# Patient Record
Sex: Male | Born: 1970
Health system: Southern US, Community
[De-identification: ages and names within clinical notes are randomized; demographics above are authoritative.]

## PROBLEM LIST (undated history)

## (undated) DIAGNOSIS — Z21 Asymptomatic human immunodeficiency virus [HIV] infection status: Secondary | ICD-10-CM

## (undated) DIAGNOSIS — F419 Anxiety disorder, unspecified: Secondary | ICD-10-CM

## (undated) DIAGNOSIS — F329 Major depressive disorder, single episode, unspecified: Secondary | ICD-10-CM

## (undated) DIAGNOSIS — B20 Human immunodeficiency virus [HIV] disease: Secondary | ICD-10-CM

## (undated) DIAGNOSIS — F32A Depression, unspecified: Secondary | ICD-10-CM

## (undated) DIAGNOSIS — IMO0001 Reserved for inherently not codable concepts without codable children: Secondary | ICD-10-CM

## (undated) DIAGNOSIS — I10 Essential (primary) hypertension: Secondary | ICD-10-CM

## (undated) HISTORY — DX: Depression, unspecified: F32.A

## (undated) HISTORY — DX: Major depressive disorder, single episode, unspecified: F32.9

## (undated) HISTORY — DX: Reserved for inherently not codable concepts without codable children: IMO0001

## (undated) HISTORY — DX: Essential (primary) hypertension: I10

## (undated) HISTORY — DX: Anxiety disorder, unspecified: F41.9

## (undated) HISTORY — DX: Human immunodeficiency virus (HIV) disease: B20

## (undated) HISTORY — DX: Asymptomatic human immunodeficiency virus (hiv) infection status: Z21

---

## 2000-01-02 ENCOUNTER — Ambulatory Visit (HOSPITAL_COMMUNITY): Admission: RE | Admit: 2000-01-02 | Discharge: 2000-01-02 | Payer: Self-pay | Admitting: Infectious Diseases

## 2000-01-02 ENCOUNTER — Encounter: Payer: Self-pay | Admitting: Infectious Disease

## 2000-01-02 ENCOUNTER — Encounter (INDEPENDENT_AMBULATORY_CARE_PROVIDER_SITE_OTHER): Payer: Self-pay | Admitting: *Deleted

## 2000-01-02 ENCOUNTER — Encounter: Admission: RE | Admit: 2000-01-02 | Discharge: 2000-01-02 | Payer: Self-pay | Admitting: Infectious Diseases

## 2000-01-04 ENCOUNTER — Encounter: Admission: RE | Admit: 2000-01-04 | Discharge: 2000-01-04 | Payer: Self-pay | Admitting: Infectious Diseases

## 2000-01-04 ENCOUNTER — Ambulatory Visit (HOSPITAL_COMMUNITY): Admission: RE | Admit: 2000-01-04 | Discharge: 2000-01-04 | Payer: Self-pay | Admitting: Infectious Diseases

## 2000-01-08 ENCOUNTER — Encounter: Admission: RE | Admit: 2000-01-08 | Discharge: 2000-01-08 | Payer: Self-pay | Admitting: Infectious Diseases

## 2000-01-15 ENCOUNTER — Encounter: Admission: RE | Admit: 2000-01-15 | Discharge: 2000-01-15 | Payer: Self-pay | Admitting: Infectious Diseases

## 2000-02-18 ENCOUNTER — Encounter: Admission: RE | Admit: 2000-02-18 | Discharge: 2000-02-18 | Payer: Self-pay | Admitting: Infectious Diseases

## 2000-04-14 ENCOUNTER — Encounter: Admission: RE | Admit: 2000-04-14 | Discharge: 2000-04-14 | Payer: Self-pay | Admitting: Infectious Diseases

## 2000-04-14 ENCOUNTER — Ambulatory Visit (HOSPITAL_COMMUNITY): Admission: RE | Admit: 2000-04-14 | Discharge: 2000-04-14 | Payer: Self-pay | Admitting: Infectious Diseases

## 2000-05-19 ENCOUNTER — Encounter: Admission: RE | Admit: 2000-05-19 | Discharge: 2000-05-19 | Payer: Self-pay | Admitting: Infectious Diseases

## 2000-07-29 ENCOUNTER — Ambulatory Visit (HOSPITAL_COMMUNITY): Admission: RE | Admit: 2000-07-29 | Discharge: 2000-07-29 | Payer: Self-pay | Admitting: Infectious Diseases

## 2000-07-29 ENCOUNTER — Encounter: Admission: RE | Admit: 2000-07-29 | Discharge: 2000-07-29 | Payer: Self-pay | Admitting: Infectious Diseases

## 2000-09-29 ENCOUNTER — Encounter: Admission: RE | Admit: 2000-09-29 | Discharge: 2000-09-29 | Payer: Self-pay | Admitting: Infectious Diseases

## 2000-12-01 ENCOUNTER — Ambulatory Visit (HOSPITAL_COMMUNITY): Admission: RE | Admit: 2000-12-01 | Discharge: 2000-12-01 | Payer: Self-pay | Admitting: Infectious Diseases

## 2000-12-01 ENCOUNTER — Encounter: Admission: RE | Admit: 2000-12-01 | Discharge: 2000-12-01 | Payer: Self-pay | Admitting: Internal Medicine

## 2000-12-15 ENCOUNTER — Encounter: Admission: RE | Admit: 2000-12-15 | Discharge: 2000-12-15 | Payer: Self-pay | Admitting: Infectious Diseases

## 2001-03-24 ENCOUNTER — Encounter: Admission: RE | Admit: 2001-03-24 | Discharge: 2001-03-24 | Payer: Self-pay | Admitting: Infectious Diseases

## 2001-03-24 ENCOUNTER — Ambulatory Visit (HOSPITAL_COMMUNITY): Admission: RE | Admit: 2001-03-24 | Discharge: 2001-03-24 | Payer: Self-pay | Admitting: Infectious Diseases

## 2001-04-07 ENCOUNTER — Encounter: Admission: RE | Admit: 2001-04-07 | Discharge: 2001-04-07 | Payer: Self-pay | Admitting: Infectious Diseases

## 2001-07-03 ENCOUNTER — Ambulatory Visit (HOSPITAL_COMMUNITY): Admission: RE | Admit: 2001-07-03 | Discharge: 2001-07-03 | Payer: Self-pay | Admitting: Infectious Diseases

## 2001-07-20 ENCOUNTER — Encounter: Admission: RE | Admit: 2001-07-20 | Discharge: 2001-07-20 | Payer: Self-pay | Admitting: Infectious Diseases

## 2001-08-31 ENCOUNTER — Encounter: Admission: RE | Admit: 2001-08-31 | Discharge: 2001-08-31 | Payer: Self-pay | Admitting: Infectious Diseases

## 2001-11-23 ENCOUNTER — Encounter: Admission: RE | Admit: 2001-11-23 | Discharge: 2001-11-23 | Payer: Self-pay | Admitting: Infectious Diseases

## 2001-11-23 ENCOUNTER — Ambulatory Visit (HOSPITAL_COMMUNITY): Admission: RE | Admit: 2001-11-23 | Discharge: 2001-11-23 | Payer: Self-pay | Admitting: Infectious Diseases

## 2001-12-07 ENCOUNTER — Encounter: Admission: RE | Admit: 2001-12-07 | Discharge: 2001-12-07 | Payer: Self-pay | Admitting: Infectious Diseases

## 2002-01-11 ENCOUNTER — Encounter: Admission: RE | Admit: 2002-01-11 | Discharge: 2002-01-11 | Payer: Self-pay | Admitting: Infectious Diseases

## 2002-03-15 ENCOUNTER — Encounter: Admission: RE | Admit: 2002-03-15 | Discharge: 2002-03-15 | Payer: Self-pay | Admitting: Infectious Diseases

## 2002-03-15 ENCOUNTER — Ambulatory Visit (HOSPITAL_COMMUNITY): Admission: RE | Admit: 2002-03-15 | Discharge: 2002-03-15 | Payer: Self-pay | Admitting: Infectious Diseases

## 2002-03-29 ENCOUNTER — Encounter: Admission: RE | Admit: 2002-03-29 | Discharge: 2002-03-29 | Payer: Self-pay | Admitting: Infectious Diseases

## 2002-09-14 ENCOUNTER — Encounter: Admission: RE | Admit: 2002-09-14 | Discharge: 2002-09-14 | Payer: Self-pay | Admitting: Infectious Diseases

## 2002-10-05 ENCOUNTER — Encounter: Admission: RE | Admit: 2002-10-05 | Discharge: 2002-10-05 | Payer: Self-pay | Admitting: Infectious Diseases

## 2002-12-30 ENCOUNTER — Encounter: Admission: RE | Admit: 2002-12-30 | Discharge: 2002-12-30 | Payer: Self-pay | Admitting: Infectious Diseases

## 2002-12-30 ENCOUNTER — Ambulatory Visit (HOSPITAL_COMMUNITY): Admission: RE | Admit: 2002-12-30 | Discharge: 2002-12-30 | Payer: Self-pay | Admitting: Infectious Diseases

## 2003-01-17 ENCOUNTER — Encounter: Admission: RE | Admit: 2003-01-17 | Discharge: 2003-01-17 | Payer: Self-pay | Admitting: Infectious Diseases

## 2003-05-17 ENCOUNTER — Encounter (INDEPENDENT_AMBULATORY_CARE_PROVIDER_SITE_OTHER): Payer: Self-pay | Admitting: Infectious Diseases

## 2003-05-17 ENCOUNTER — Ambulatory Visit (HOSPITAL_COMMUNITY): Admission: RE | Admit: 2003-05-17 | Discharge: 2003-05-17 | Payer: Self-pay | Admitting: Infectious Diseases

## 2003-05-17 ENCOUNTER — Encounter: Admission: RE | Admit: 2003-05-17 | Discharge: 2003-05-17 | Payer: Self-pay | Admitting: Infectious Diseases

## 2003-06-06 ENCOUNTER — Encounter: Admission: RE | Admit: 2003-06-06 | Discharge: 2003-06-06 | Payer: Self-pay | Admitting: Infectious Diseases

## 2004-03-27 ENCOUNTER — Ambulatory Visit: Payer: Self-pay | Admitting: Infectious Diseases

## 2004-03-27 ENCOUNTER — Ambulatory Visit (HOSPITAL_COMMUNITY): Admission: RE | Admit: 2004-03-27 | Discharge: 2004-03-27 | Payer: Self-pay | Admitting: Infectious Diseases

## 2004-04-09 ENCOUNTER — Ambulatory Visit: Payer: Self-pay | Admitting: Infectious Diseases

## 2004-12-08 ENCOUNTER — Emergency Department (HOSPITAL_COMMUNITY): Admission: EM | Admit: 2004-12-08 | Discharge: 2004-12-08 | Payer: Self-pay | Admitting: Emergency Medicine

## 2005-03-04 ENCOUNTER — Ambulatory Visit: Payer: Self-pay | Admitting: Infectious Diseases

## 2005-03-04 ENCOUNTER — Ambulatory Visit (HOSPITAL_COMMUNITY): Admission: RE | Admit: 2005-03-04 | Discharge: 2005-03-04 | Payer: Self-pay | Admitting: Infectious Diseases

## 2005-03-04 ENCOUNTER — Encounter (INDEPENDENT_AMBULATORY_CARE_PROVIDER_SITE_OTHER): Payer: Self-pay | Admitting: *Deleted

## 2005-03-25 ENCOUNTER — Ambulatory Visit: Payer: Self-pay | Admitting: Infectious Diseases

## 2005-11-25 ENCOUNTER — Encounter (INDEPENDENT_AMBULATORY_CARE_PROVIDER_SITE_OTHER): Payer: Self-pay | Admitting: *Deleted

## 2005-11-25 ENCOUNTER — Ambulatory Visit: Payer: Self-pay | Admitting: Infectious Diseases

## 2005-11-25 ENCOUNTER — Encounter: Admission: RE | Admit: 2005-11-25 | Discharge: 2005-11-25 | Payer: Self-pay | Admitting: Infectious Diseases

## 2005-11-25 LAB — CONVERTED CEMR LAB: HIV 1 RNA Quant: 49 copies/mL

## 2005-12-09 ENCOUNTER — Ambulatory Visit: Payer: Self-pay | Admitting: Infectious Diseases

## 2006-07-28 ENCOUNTER — Encounter (INDEPENDENT_AMBULATORY_CARE_PROVIDER_SITE_OTHER): Payer: Self-pay | Admitting: *Deleted

## 2006-07-28 ENCOUNTER — Encounter: Admission: RE | Admit: 2006-07-28 | Discharge: 2006-07-28 | Payer: Self-pay | Admitting: Infectious Diseases

## 2006-07-28 ENCOUNTER — Ambulatory Visit: Payer: Self-pay | Admitting: Infectious Diseases

## 2006-07-28 LAB — CONVERTED CEMR LAB
ALT: 11 units/L (ref 0–53)
Alkaline Phosphatase: 58 units/L (ref 39–117)
Basophils Absolute: 0 10*3/uL (ref 0.0–0.1)
CD4 Count: 450 microliters
CO2: 26 meq/L (ref 19–32)
Hemoglobin: 16 g/dL (ref 13.0–17.0)
MCHC: 34 g/dL (ref 30.0–36.0)
Monocytes Absolute: 0.3 10*3/uL (ref 0.2–0.7)
Neutro Abs: 3.2 10*3/uL (ref 1.7–7.7)
Platelets: 271 10*3/uL (ref 150–400)
RDW: 13.9 % (ref 11.5–14.0)
Sodium: 140 meq/L (ref 135–145)
Total Bilirubin: 0.3 mg/dL (ref 0.3–1.2)
Total Protein: 7.4 g/dL (ref 6.0–8.3)

## 2006-08-11 ENCOUNTER — Ambulatory Visit: Payer: Self-pay | Admitting: Infectious Diseases

## 2006-08-18 ENCOUNTER — Encounter (INDEPENDENT_AMBULATORY_CARE_PROVIDER_SITE_OTHER): Payer: Self-pay | Admitting: Infectious Diseases

## 2006-08-18 DIAGNOSIS — B20 Human immunodeficiency virus [HIV] disease: Secondary | ICD-10-CM | POA: Insufficient documentation

## 2006-09-01 ENCOUNTER — Encounter (INDEPENDENT_AMBULATORY_CARE_PROVIDER_SITE_OTHER): Payer: Self-pay | Admitting: *Deleted

## 2006-09-01 LAB — CONVERTED CEMR LAB

## 2006-09-14 ENCOUNTER — Encounter (INDEPENDENT_AMBULATORY_CARE_PROVIDER_SITE_OTHER): Payer: Self-pay | Admitting: *Deleted

## 2007-08-18 ENCOUNTER — Ambulatory Visit: Payer: Self-pay | Admitting: Infectious Disease

## 2007-08-18 ENCOUNTER — Encounter: Admission: RE | Admit: 2007-08-18 | Discharge: 2007-08-18 | Payer: Self-pay | Admitting: Infectious Disease

## 2007-08-18 LAB — CONVERTED CEMR LAB
ALT: 16 units/L (ref 0–53)
Albumin: 4.8 g/dL (ref 3.5–5.2)
Basophils Absolute: 0 10*3/uL (ref 0.0–0.1)
CO2: 23 meq/L (ref 19–32)
Cholesterol: 135 mg/dL (ref 0–200)
Eosinophils Relative: 2 % (ref 0–5)
Glucose, Bld: 90 mg/dL (ref 70–99)
HCT: 47.6 % (ref 39.0–52.0)
HIV 1 RNA Quant: 203 copies/mL — ABNORMAL HIGH (ref <50)
HIV-1 RNA Quant, Log: 2.31 — ABNORMAL HIGH (ref <1.70)
Hemoglobin: 15.6 g/dL (ref 13.0–17.0)
LDL Cholesterol: 72 mg/dL (ref 0–99)
Lymphs Abs: 1 10*3/uL (ref 0.7–4.0)
MCHC: 32.8 g/dL (ref 30.0–36.0)
MCV: 92.8 fL (ref 78.0–100.0)
Potassium: 4.5 meq/L (ref 3.5–5.3)
RBC: 5.13 M/uL (ref 4.22–5.81)
Sodium: 140 meq/L (ref 135–145)
Total Bilirubin: 0.3 mg/dL (ref 0.3–1.2)
Total Protein: 7.5 g/dL (ref 6.0–8.3)
Triglycerides: 147 mg/dL (ref <150)
VLDL: 29 mg/dL (ref 0–40)

## 2007-09-25 ENCOUNTER — Encounter (INDEPENDENT_AMBULATORY_CARE_PROVIDER_SITE_OTHER): Payer: Self-pay | Admitting: *Deleted

## 2007-09-28 ENCOUNTER — Ambulatory Visit: Payer: Self-pay | Admitting: Infectious Disease

## 2007-09-28 DIAGNOSIS — J301 Allergic rhinitis due to pollen: Secondary | ICD-10-CM | POA: Insufficient documentation

## 2007-09-28 LAB — CONVERTED CEMR LAB: Hep A Total Ab: NEGATIVE

## 2008-01-18 ENCOUNTER — Ambulatory Visit: Payer: Self-pay | Admitting: Infectious Disease

## 2008-01-18 ENCOUNTER — Encounter: Admission: RE | Admit: 2008-01-18 | Discharge: 2008-01-18 | Payer: Self-pay | Admitting: Infectious Disease

## 2008-01-18 LAB — CONVERTED CEMR LAB
ALT: 13 units/L (ref 0–53)
AST: 16 units/L (ref 0–37)
Albumin: 4.7 g/dL (ref 3.5–5.2)
Basophils Absolute: 0 10*3/uL (ref 0.0–0.1)
Basophils Relative: 1 % (ref 0–1)
CO2: 23 meq/L (ref 19–32)
Calcium: 9.7 mg/dL (ref 8.4–10.5)
Chloride: 103 meq/L (ref 96–112)
Lymphocytes Relative: 23 % (ref 12–46)
MCHC: 32.8 g/dL (ref 30.0–36.0)
Neutro Abs: 2.6 10*3/uL (ref 1.7–7.7)
Neutrophils Relative %: 63 % (ref 43–77)
Potassium: 4.7 meq/L (ref 3.5–5.3)
RBC: 5.15 M/uL (ref 4.22–5.81)
RDW: 14.8 % (ref 11.5–15.5)
Total Protein: 7.3 g/dL (ref 6.0–8.3)

## 2008-02-01 ENCOUNTER — Ambulatory Visit: Payer: Self-pay | Admitting: Infectious Disease

## 2008-02-19 ENCOUNTER — Encounter: Payer: Self-pay | Admitting: Infectious Disease

## 2008-02-24 ENCOUNTER — Ambulatory Visit (HOSPITAL_COMMUNITY): Admission: RE | Admit: 2008-02-24 | Discharge: 2008-02-24 | Payer: Self-pay | Admitting: Family Medicine

## 2008-05-23 ENCOUNTER — Ambulatory Visit: Payer: Self-pay | Admitting: Infectious Disease

## 2008-05-23 LAB — CONVERTED CEMR LAB
ALT: 15 units/L (ref 0–53)
Basophils Absolute: 0 10*3/uL (ref 0.0–0.1)
CO2: 25 meq/L (ref 19–32)
Cholesterol: 154 mg/dL (ref 0–200)
Creatinine, Ser: 0.98 mg/dL (ref 0.40–1.50)
Eosinophils Absolute: 0.1 10*3/uL (ref 0.0–0.7)
Eosinophils Relative: 2 % (ref 0–5)
HCT: 47.9 % (ref 39.0–52.0)
HIV-1 RNA Quant, Log: 2.54 — ABNORMAL HIGH (ref <1.68)
Hemoglobin: 15.4 g/dL (ref 13.0–17.0)
Lymphocytes Relative: 23 % (ref 12–46)
MCHC: 32.2 g/dL (ref 30.0–36.0)
MCV: 95 fL (ref 78.0–100.0)
Monocytes Absolute: 0.3 10*3/uL (ref 0.1–1.0)
RDW: 14.2 % (ref 11.5–15.5)
Total Bilirubin: 0.4 mg/dL (ref 0.3–1.2)
Total CHOL/HDL Ratio: 3.8
Triglycerides: 123 mg/dL (ref <150)
VLDL: 25 mg/dL (ref 0–40)

## 2008-06-13 ENCOUNTER — Ambulatory Visit: Payer: Self-pay | Admitting: Infectious Disease

## 2008-10-03 ENCOUNTER — Ambulatory Visit: Payer: Self-pay | Admitting: Infectious Disease

## 2008-10-03 LAB — CONVERTED CEMR LAB
Bilirubin Urine: NEGATIVE
CO2: 26 meq/L (ref 19–32)
Cholesterol: 170 mg/dL (ref 0–200)
Creatinine, Ser: 1.1 mg/dL (ref 0.40–1.50)
GFR calc Af Amer: >60 mL/min (ref >60)
GFR calc non Af Amer: >60 mL/min (ref >60)
Glucose, Bld: 87 mg/dL (ref 70–99)
HIV 1 RNA Quant: 776 copies/mL — ABNORMAL HIGH (ref <48)
Hemoglobin: 15.6 g/dL (ref 13.0–17.0)
Lymphocytes Relative: 19 % (ref 12–46)
Monocytes Absolute: 0.3 10*3/uL (ref 0.1–1.0)
Monocytes Relative: 6 % (ref 3–12)
Neutro Abs: 3.8 10*3/uL (ref 1.7–7.7)
Protein, ur: NEGATIVE mg/dL
RBC: 5.26 M/uL (ref 4.22–5.81)
Total Bilirubin: 0.3 mg/dL (ref 0.3–1.2)
Total CHOL/HDL Ratio: 4.5
Triglycerides: 172 mg/dL — ABNORMAL HIGH (ref <150)
Urine Glucose: NEGATIVE mg/dL
VLDL: 34 mg/dL (ref 0–40)
WBC: 5.3 10*3/uL (ref 4.0–10.5)
pH: 6 (ref 5.0–8.0)

## 2009-11-15 ENCOUNTER — Ambulatory Visit: Payer: Self-pay | Admitting: Infectious Disease

## 2009-11-15 LAB — CONVERTED CEMR LAB
ALT: 13 units/L (ref 0–53)
Albumin: 4.6 g/dL (ref 3.5–5.2)
Basophils Relative: 1 % (ref 0–1)
Bilirubin Urine: NEGATIVE
CO2: 27 meq/L (ref 19–32)
Calcium: 9.1 mg/dL (ref 8.4–10.5)
Chlamydia, Swab/Urine, PCR: NEGATIVE
Chloride: 103 meq/L (ref 96–112)
Glucose, Bld: 73 mg/dL (ref 70–99)
Hemoglobin: 15.4 g/dL (ref 13.0–17.0)
Ketones, ur: NEGATIVE mg/dL
Lymphocytes Relative: 22 % (ref 12–46)
Lymphs Abs: 1 10*3/uL (ref 0.7–4.0)
MCHC: 33 g/dL (ref 30.0–36.0)
Monocytes Absolute: 0.3 10*3/uL (ref 0.1–1.0)
Monocytes Relative: 7 % (ref 3–12)
Neutro Abs: 3.1 10*3/uL (ref 1.7–7.7)
Potassium: 4 meq/L (ref 3.5–5.3)
Protein, ur: NEGATIVE mg/dL
RBC: 4.97 M/uL (ref 4.22–5.81)
Sodium: 139 meq/L (ref 135–145)
Total Protein: 7 g/dL (ref 6.0–8.3)
Urine Glucose: 100 mg/dL — AB
Urobilinogen, UA: 0.2 (ref 0.0–1.0)
WBC: 4.4 10*3/uL (ref 4.0–10.5)

## 2009-11-30 ENCOUNTER — Ambulatory Visit: Payer: Self-pay | Admitting: Infectious Disease

## 2009-11-30 DIAGNOSIS — J3489 Other specified disorders of nose and nasal sinuses: Secondary | ICD-10-CM | POA: Insufficient documentation

## 2009-11-30 DIAGNOSIS — Z87891 Personal history of nicotine dependence: Secondary | ICD-10-CM | POA: Insufficient documentation

## 2009-11-30 DIAGNOSIS — R635 Abnormal weight gain: Secondary | ICD-10-CM | POA: Insufficient documentation

## 2010-06-05 ENCOUNTER — Encounter (INDEPENDENT_AMBULATORY_CARE_PROVIDER_SITE_OTHER): Payer: Self-pay | Admitting: *Deleted

## 2010-08-07 NOTE — Assessment & Plan Note (Signed)
Summary: resc from am/vs   Visit Type:  Follow-up Primary Provider:  Paulette Blanch Dam MD  CC:  f/u labs.  History of Present Illness: 40 yo Caucasian male with HIV perfectly suppressed with healthy cd4 count. He complains of weight gain but otherwise has not complaints. He is free of tobacco. His seasonal allergies are not bothering him at present.   Problems Prior to Update: 1)  Preventive Health Care  (ICD-V70.0) 2)  Allergic Rhinitis, Seasonal  (ICD-477.0) 3)  HIV Disease  (ICD-042)  Medications Prior to Update: 1)  Atripla 600-200-300 Mg Tabs (Efavirenz-Emtricitab-Tenofovir) .... Take 1 Tablet By Mouth At Bedtime 2)  Nasonex 50 Mcg/act Susp (Mometasone Furoate) .... One To Two Puffs Each Nose Once A Day  Current Medications (verified): 1)  Atripla 600-200-300 Mg Tabs (Efavirenz-Emtricitab-Tenofovir) .... Take 1 Tablet By Mouth At Bedtime 2)  Nasonex 50 Mcg/act Susp (Mometasone Furoate) .... One To Two Puffs Each Nose Once A Day  Allergies: 1)  ! Codeine   Preventive Screening-Counseling & Management  Alcohol-Tobacco     Alcohol drinks/day: 0     Smoking Status: quit     Smoking Cessation Counseling: 03/25/2005     Year Quit: 2007  Caffeine-Diet-Exercise     Caffeine use/day: 2-3 cups of coffee, 2 soft drinks     Does Patient Exercise: yes     Type of exercise: work out at home     Exercise (avg: min/session): 30-60     Times/week: 3   Current Allergies (reviewed today): ! CODEINE Family History: Reviewed history from 10/03/2008 and no changes required. Dad with CHF atrial fibrilatoin and CVA Mom with cerebral hemmorrhage No history of colon cancer  Social History: Reviewed history from 09/28/2007 and no changes required. 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    Vital Signs:  Patient profile:   40 year old male Height:      73 inches (185.42 cm) Weight:      225.4 pounds (102.45 kg) BMI:     29.85 Pulse rate:   96 / minute BP sitting:   119 / 74  (left arm)  Vitals Entered By: Starleen Arms CMA (Nov 30, 2009 2:39 PM) CC: f/u labs Is Patient Diabetic? No Pain Assessment Patient in pain? no      Nutritional Status BMI of 25 - 29 = overweight Nutritional Status Detail nl  Does patient need assistance? Functional Status Self care Ambulation Normal   Physical Exam  General:  alert, well-developed, and well-nourished.   Head:  normocephalic, atraumatic, and no abnormalities observed.   Ears:  no external deformities.   Nose:  no external deformity no nasal discharge.   Mouth:  good dentition, no gingival abnormalities, no dental plaque, pharynx pink and moist, no erythema, and no exudates.   Neck:  supple and full ROM.   Lungs:  normal respiratory effort, no crackles, and no wheezes.   Heart:  normal rate, regular rhythm, no murmur, no gallop, and no rub.   Abdomen:  soft, non-tender, normal bowel sounds, no distention, and no masses.   Msk:  normal ROM and no joint  deformities.   Extremities:  No clubbing, cyanosis, edema, or deformity noted with normal full range of motion of all joints.   Neurologic:  alert & oriented X3, strength normal in all extremities, and gait normal.   Skin:  no rashes and no suspicious lesions.   Psych:  Oriented X3.  memory intact for recent and remote, normally interactive, and good eye contact.          Medication Adherence: 11/30/2009   Adherence to  medications reviewed with patient. Counseling to provide adequate adherence provided   Prevention For Positives: 11/30/2009   Safe sex practices discussed with patient. Condoms offered.   Education Materials Provided: 11/30/2009 Safe sex practices discussed with patient. Condoms offered.                          Impression & Recommendations:  Problem # 1:  HIV DISEASE (ICD-042) Excellent control! Orders: Est. Patient Level IV (28413)  Diagnostics Reviewed:  HIV: CDC-defined AIDS (02/19/2008)   CD4: 450 (11/15/2009)   WBC: 4.4 (11/15/2009)   Hgb: 15.4 (11/15/2009)   HCT: 46.6 (11/15/2009)   Platelets: 240 (11/15/2009) HIV-1 RNA: <48 copies/mL (11/15/2009)   HBSAg: No (09/01/2006)  Problem # 2:  ALLERGIC RHINITIS, SEASONAL (ICD-477.0) not active Orders: Est. Patient Level IV (24401)  Problem # 3:  TOBACCO ABUSE, HX OF (ICD-V15.82) still has not resumed, excellent job Orders: Est. Patient Level IV (02725)  Problem # 4:  OTHER DISEASES OF NASAL CAVITY AND SINUSES (ICD-478.19) no problems with sinuses at present Orders: Est. Patient Level IV (36644)  Problem # 5:  WEIGHT GAIN (ICD-783.1) counselled ton increase exercise and to try to cut calories and high carbohydrate foods from diet and increase fiber  Other Orders: Hepatitis A Vaccine (Adult Dose) (03474) Admin 1st Vaccine (25956) Future Orders: T-CD4SP ([facility]) (CD4SP) ... 05/29/2010 T-HIV Viral Load [id number]) ... 05/29/2010 T-CBC w/Diff ([id number]) ... 05/29/2010 T-Comprehensive Metabolic Panel [id number]) ... 05/29/2010 T-Lipid Profile [id number]) ... 05/29/2010 T-RPR (Syphilis) [phone]) ... 05/29/2010  Patient Instructions: 1)  Please schedule a follow-up appointment in 6 months. 2)  come in fasting for labs at next appt    Immunizations Administered:  Hepatitis A Vaccine # 2:    Vaccine Type: HepA    Site: left deltoid    Mfr: GlaxoSmithKline    Dose: 0.5 ml    Route: IM    Given  by: Starleen Arms CMA    Exp. Date: 09/09/2011    Lot #: [id number]    VIS given: 09/25/04 version given Nov 30, 2009.

## 2010-08-07 NOTE — Miscellaneous (Signed)
Summary: RW - AIDS Year  Clinical Lists Changes  Observations: Added new observation of YEARAIDSPOS: 2001  (02/19/2008 14:53) Added new observation of HIV STATUS: CDC-defined AIDS  (02/19/2008 14:53)

## 2010-08-07 NOTE — Miscellaneous (Signed)
  Clinical Lists Changes 

## 2010-08-07 NOTE — Miscellaneous (Signed)
Summary: needs Flu vaccine and Pneumovax @ nest visit  Clinical Lists Changes Needs Flu vaccine and Pneumovax @ nest visit. ..................................................................Marland KitchenJennet Maduro RN  September 25, 2007 10:10 AM  Observations: Added new observation of FLU VAX: Historical (07/08/2005 10:10) Added new observation of PNEUMOVAX: Historical (04/14/2000 10:10)         Influenza Immunization History:    Influenza # 1:  Historical (07/08/2005)  Pneumovax Immunization History:    Pneumovax # 1:  Historical (04/14/2000)

## 2010-08-07 NOTE — Miscellaneous (Signed)
Summary: Orders Update  Clinical Lists Changes  Orders: Added new Test order of T-CBC w/Diff ([id number]) - Signed 

## 2010-08-07 NOTE — Letter (Signed)
Summary: Thomas Armstrong-08/11/2006  Thomas Armstrong-08/11/2006   Imported By: Dorice Lamas 08/18/2006 13:32:07  _____________________________________________________________________  External Attachment:    Type:   Image     Comment:   External Document

## 2010-08-07 NOTE — Miscellaneous (Signed)
Summary: Lab orders  Clinical Lists Changes  Orders: Added new Test order of T-CBC w/Diff [id number]) - Signed Added new Test order of T-CD4 [id number]) - Signed Added new Test order of T-Comprehensive Metabolic Panel [id number]) - Signed Added new Test order of T-HIV Viral Load [phone]) - Signed Added new Test order of T-Lipid Profile ([id number]) - Signed Added new Test order of T-RPR (Syphilis) ([id number]) - Signed

## 2010-08-07 NOTE — Assessment & Plan Note (Signed)
Summary: Thomas Armstrong   Chief Complaint:  f/u.  History of Present Illness: 40 yo Caucasian male with HIV currently with viral load of 200 and cd4 count 501. He and his wife succesfully had [place] vitro [place] with sperm washing and they have a new Baby girl. He is sexually active only with his wife (HIV negative)and is using condoms with all forms of intercourse. He claims 100% adherence to  his atripla. He is in good spriits. He has no complaints today and denies depression.    Current Allergies: No known allergies   Past Medical History:    HIV disease    PCP in 2001    Herpes stomatitis     candida     verruca vulgaris   Family History:    Dad with CHF atrial fibrilatoin and CVA    Mom with cerebral hemmorrhage  Social History:    Producer, television/film/video for [employer]    Married    Former Smoker    Alcohol use-no    Drug use-no   Risk Factors:  Tobacco use:  quit Drug use:  no Alcohol use:  no   Review of Systems  The patient denies anorexia, fever, weight loss, weight gain, vision loss, decreased hearing, hoarseness, chest pain, syncope, dyspnea on exhertion, peripheral edema, prolonged cough, hemoptysis, abdominal pain, melena, hematochezia, severe indigestion/heartburn, and hematuria.     Vital Signs:  Patient Profile:   40 Years Old Male Height:     73 inches (185.42 cm) Weight:      211.50 pounds (96.14 kg) BMI:     28.00 Temp:     98.1 degrees F (36.72 degrees C) oral Pulse rate:   69 / minute BP sitting:   112 / 74  Pt. in pain?   no  Vitals Entered By: Starleen Arms (September 28, 2007 11:19 AM)              Is Patient Diabetic? No Nutritional Status BMI of 25 - 29 = overweight  Does patient need assistance? Functional Status Self care Ambulation Normal Comments no missed doses     Physical Exam  General:     alert, well-developed, and well-nourished.   Head:     normocephalic, atraumatic, and no abnormalities  observed.   Eyes:     vision grossly intact, pupils equal, pupils round, and pupils reactive to light.   Ears:     no external deformities.   Nose:     no external deformity.   Mouth:     good dentition, no gingival abnormalities, no dental plaque, pharynx pink and moist, no erythema, and no exudates.   Neck:     full ROM.   Lungs:     normal respiratory effort, no crackles, and no wheezes.   Heart:     normal rate, regular rhythm, no murmur, no gallop, and no rub.   Abdomen:     soft, non-tender, normal bowel sounds, no distention, and no masses.   Msk:     normal ROM.   Extremities:     No clubbing, cyanosis, edema, or deformity noted with normal full range of motion of all joints.   Neurologic:     alert & oriented X3.      Impression & Recommendations:  Problem # 1:  HIV DISEASE (ICD-042) I am not terribly concerned about the viral load of 200. He claims near 100% compliance. Continue atripla His updated medication list for this problem includes:  Atripla 600-200-300 Mg Tabs (Efavirenz-emtricitab-tenofovir) .Marland Kitchen... Take 1 tablet by mouth at bedtime  Orders: T-Hepatitis A Antibody ([id number]) Est. Patient Level IV (09811)  Future Orders: T-Comprehensive Metabolic Panel ([id number]) ... 12/14/2007 T-CD4 ([id number]) ... 12/14/2007 T-HIV Viral Load [phone]) ... 12/14/2007 T-CBC w/Diff ([id number]) ... 12/14/2007   Problem # 2:  ALLERGIC RHINITIS, SEASONAL (ICD-477.0) He takes otc nonsedating antihistamines for this. Orders: Est. Patient Level IV (36644)   Problem # 3:  PREVENTIVE HEALTH CARE (ICD-V70.0) check hep A ab. Orders: Est. Patient Level IV (03474)   Other Orders: Pneumococcal Vaccine (25956) Admin 1st Vaccine (38756)   Patient Instructions: 1)  Please schedule a follow-up appointment in late July or early August.    Prescriptions: ATRIPLA 600-200-300 MG TABS (EFAVIRENZ-EMTRICITAB-TENOFOVIR) Take 1 tablet by mouth at bedtime  #93 x  4   Entered and Authorized by:   Acey Lav MD   Signed by:   Paulette Blanch Dam MD on 09/28/2007   Method used:   Print then Give to Patient   RxID:   [id number]  ]  Influenza Immunization History:    Influenza # 1:  Historical (05/09/2007)  Pneumovax Vaccine    Vaccine Type: Pneumovax    Site: right buttock    Mfr: Merck    Dose: 0.5 ml    Route: IM    Given by: Starleen Arms    Exp. Date: 12/05/2008    Lot #: [id number]    VIS given: 02/03/96 version given September 28, 2007.

## 2010-08-07 NOTE — Miscellaneous (Signed)
Summary: Orders Update  Clinical Lists Changes  Orders: Added new Test order of T-Chlamydia  Probe, urine ([id number]) - Signed Added new Test order of T-CBC w/Diff ([address]-100[address]) - Signed Added new Test order of T-CD4SP ([facility]) (CD4SP) - Signed Added new Test order of T-GC Probe, urine ([id number]) - Signed Added new Test order of T-Comprehensive Metabolic Panel ([id number]) - Signed Added new Test order of T-HIV Viral Load ([id number]) - Signed Added new Test order of T-RPR (Syphilis) ([id number]) - Signed Added new Test order of T-Urinalysis (81003-65000) - Signed 

## 2010-08-07 NOTE — Assessment & Plan Note (Signed)
Summary: FU OV   Chief Complaint:  f/u ov.  History of Present Illness: 40 yo Caucasian male with HIV currently with viral load o 61 and cd4 count 450 . His little girl is now 53 months old and walking. l. He is sexually active only with his wife (HIV negative)and is using condoms with all forms of intercourse. He claims 100% adherence to  his atripla. He is exercising regularly. He has no complaints.     Prior Medications Reviewed Using: Patient Recall  Updated Prior Medication List: ATRIPLA 600-200-300 MG TABS (EFAVIRENZ-EMTRICITAB-TENOFOVIR) Take 1 tablet by mouth at bedtime  Current Allergies (reviewed today): ! CODEINE  Past Medical History:    Reviewed history from 09/28/2007 and no changes required:       HIV disease       PCP in 2001       Herpes stomatitis        candida        verruca vulgaris   Family History:    Reviewed history from 09/28/2007 and no changes required:       Dad with CHF atrial fibrilatoin and CVA       Mom with cerebral hemmorrhage  Social History:    Reviewed history from 09/28/2007 and no changes required:       Occupation:manager for [employer]       Married       Former Smoker       Alcohol use-no       Drug use-no   Risk Factors: Tobacco use:  quit    Year quit:  2007 Drug use:  no Alcohol use:  no   Review of Systems  The patient denies anorexia, fever, weight loss, weight gain, vision loss, decreased hearing, hoarseness, chest pain, syncope, dyspnea on exertion, peripheral edema, prolonged cough, headaches, hemoptysis, abdominal pain, severe indigestion/heartburn, hematuria, incontinence, genital sores, suspicious skin lesions, difficulty walking, and depression.     Vital Signs:  Patient Profile:   40 Years Old Male Height:     73 inches (185.42 cm) Weight:      208.8 pounds BMI:     27.65 BSA:     2.19 Temp:     98.2 degrees F oral BP sitting:   117 / 77  (left arm)  Pt. in pain?   no  Vitals Entered By: Tomasita Morrow RN (February 01, 2008 9:48 AM)              Is Patient Diabetic? No Nutritional Status BMI of 25 - 29 = overweight Nutritional Status Detail normal  Have you ever been in a relationship where you felt threatened, hurt or afraid?No  Domestic Violence Intervention noe  Does patient need assistance? Functional Status Self care Ambulation Normal Comments No missed doses of HAART      Physical Exam  General:     alert, well-developed, and well-nourished.   Head:     normocephalic, atraumatic, and no abnormalities observed.   Eyes:     vision grossly intact, pupils equal, pupils round, and pupils reactive to light.   Ears:     no external deformities.   Nose:     no external deformity.   Mouth:     good dentition, no gingival abnormalities, no dental plaque, pharynx pink and moist, no erythema, and no exudates.   Neck:     full ROM.   Lungs:     normal respiratory effort, no crackles, and no wheezes.  Heart:     normal rate, regular rhythm, no murmur, no gallop, and no rub.   Abdomen:     soft, non-tender, normal bowel sounds, no distention, and no masses.   Msk:     normal ROM.   Extremities:     No clubbing, cyanosis, edema, or deformity noted with normal full range of motion of all joints.   Neurologic:     alert & oriented X3.   Psych:     Oriented X3 and normally interactive.      Impression & Recommendations:  Problem # 1:  HIV DISEASE (ICD-042) Assessment: Improved Suppressed. His updated medication list for this problem includes:    Atripla 600-200-300 Mg Tabs (Efavirenz-emtricitab-tenofovir) .Marland Kitchen... Take 1 tablet by mouth at bedtime  Orders: Est. Patient Level IV (82956) T-Chlamydia  Probe, urine ([id number])   Problem # 2:  ALLERGIC RHINITIS, SEASONAL (ICD-477.0) controlled Orders: Est. Patient Level IV (69629)   Problem # 3:  PREVENTIVE HEALTH CARE (ICD-V70.0) update vacciations, reinforced exercise. Orders: Est. Patient Level IV  (52841)   Other Orders: Est. Patient Level IV (32440) T-GC Probe, urine ([id number]) Est. Patient Level IV (40347)  Future Orders: T-CD4 (42595-63875) ... 05/01/2008 T-HIV Viral Load [id number]) ... 05/01/2008 T-Lipid Profile [id number]) ... 05/01/2008 T-Comprehensive Metabolic Panel [id number]) ... 05/01/2008    Hepatitis A Vaccine # 1 (to be given today)     Patient Instructions: 1)  Please schedule a follow-up appointment in 4 months. 2)  Please come in fasting for your next set of labs   ]

## 2010-08-29 ENCOUNTER — Telehealth: Payer: Self-pay | Admitting: Infectious Disease

## 2010-09-25 LAB — T-HELPER CELL (CD4) - (RCID CLINIC ONLY): CD4 % Helper T Cell: 48 % (ref 33–55)

## 2010-10-04 NOTE — Progress Notes (Addendum)
Summary: Can we call Thomas Armstrong again  Phone Note Outgoing Call   Call placed by: Acey Lav MD,  August 29, 2010 3:52 PM Details for Reason: NEEDS FLU SHOT Summary of Call: I tried to reach Thomas Armstrong but only got his answering machine. He needs to come in for flu shot and 6 month labs. Initial call taken by: Acey Lav MD,  August 29, 2010 3:53 PM  Follow-up for Phone Call        I called and left a message as well on the ans. machine Follow-up by: Starleen Arms CMA,  September 10, 2010 8:49 AM  Additional Follow-up for Phone Call Additional follow up Details #1::        Can we call Thomas Armstrong about flu shot again? Thanks Additional Follow-up by: Acey Lav MD,  September 18, 2010 9:58 AM

## 2010-10-18 LAB — T-HELPER CELL (CD4) - (RCID CLINIC ONLY)
CD4 % Helper T Cell: 51 % (ref 33–55)
CD4 T Cell Abs: 550 uL (ref 400–2700)

## 2010-10-29 ENCOUNTER — Other Ambulatory Visit: Payer: Self-pay

## 2010-11-05 ENCOUNTER — Other Ambulatory Visit: Payer: Self-pay | Admitting: Licensed Clinical Social Worker

## 2010-11-05 ENCOUNTER — Other Ambulatory Visit (INDEPENDENT_AMBULATORY_CARE_PROVIDER_SITE_OTHER): Payer: BC Managed Care – PPO

## 2010-11-05 DIAGNOSIS — B20 Human immunodeficiency virus [HIV] disease: Secondary | ICD-10-CM

## 2010-11-05 LAB — CBC WITH DIFFERENTIAL/PLATELET
Hemoglobin: 16.1 g/dL (ref 13.0–17.0)
Lymphocytes Relative: 22 % (ref 12–46)
Lymphs Abs: 1.1 10*3/uL (ref 0.7–4.0)
MCH: 30.5 pg (ref 26.0–34.0)
Monocytes Relative: 6 % (ref 3–12)
Neutro Abs: 3.5 10*3/uL (ref 1.7–7.7)
Neutrophils Relative %: 70 % (ref 43–77)
Platelets: 249 10*3/uL (ref 150–400)
RBC: 5.28 MIL/uL (ref 4.22–5.81)
WBC: 4.9 10*3/uL (ref 4.0–10.5)

## 2010-11-06 LAB — COMPLETE METABOLIC PANEL WITH GFR
ALT: 22 U/L (ref 0–53)
AST: 20 U/L (ref 0–37)
Albumin: 4.6 g/dL (ref 3.5–5.2)
Alkaline Phosphatase: 57 U/L (ref 39–117)
BUN: 13 mg/dL (ref 6–23)
Calcium: 10 mg/dL (ref 8.4–10.5)
Chloride: 101 mEq/L (ref 96–112)
Creat: 0.92 mg/dL (ref 0.40–1.50)
Potassium: 4.3 mEq/L (ref 3.5–5.3)

## 2010-11-06 LAB — T-HELPER CELL (CD4) - (RCID CLINIC ONLY)
CD4 % Helper T Cell: 54 % (ref 33–55)
CD4 T Cell Abs: 580 uL (ref 400–2700)

## 2010-11-07 LAB — HIV-1 RNA QUANT-NO REFLEX-BLD
HIV 1 RNA Quant: <20 copies/mL (ref <20)
HIV-1 RNA Quant, Log: <1.3 {Log} (ref <1.30)

## 2010-11-13 ENCOUNTER — Other Ambulatory Visit: Payer: Self-pay | Admitting: Licensed Clinical Social Worker

## 2010-11-13 DIAGNOSIS — B20 Human immunodeficiency virus [HIV] disease: Secondary | ICD-10-CM

## 2010-11-13 MED ORDER — EFAVIRENZ-EMTRICITAB-TENOFOVIR 600-200-300 MG PO TABS: 1.0000 | ORAL_TABLET | Freq: Every day | ORAL | Status: DC

## 2010-11-19 ENCOUNTER — Ambulatory Visit (INDEPENDENT_AMBULATORY_CARE_PROVIDER_SITE_OTHER): Payer: BC Managed Care – PPO | Admitting: Infectious Disease

## 2010-11-19 ENCOUNTER — Encounter: Payer: Self-pay | Admitting: Infectious Disease

## 2010-11-19 VITALS — BP 115/80 | HR 84 | Temp 98.1°F | Ht 73.0 in | Wt 233.2 lb

## 2010-11-19 DIAGNOSIS — R635 Abnormal weight gain: Secondary | ICD-10-CM

## 2010-11-19 DIAGNOSIS — B20 Human immunodeficiency virus [HIV] disease: Secondary | ICD-10-CM

## 2010-11-19 DIAGNOSIS — Z87891 Personal history of nicotine dependence: Secondary | ICD-10-CM

## 2010-11-19 DIAGNOSIS — J301 Allergic rhinitis due to pollen: Secondary | ICD-10-CM

## 2010-11-19 NOTE — Assessment & Plan Note (Signed)
Recommended low carbohydrate diet and exercise

## 2010-11-19 NOTE — Assessment & Plan Note (Signed)
Has not relapses, has stopped

## 2010-11-19 NOTE — Assessment & Plan Note (Signed)
Doing superbly well. Continue Atripla

## 2010-11-19 NOTE — Assessment & Plan Note (Signed)
Not active at present. Sounds like he could have a flare in February

## 2010-11-19 NOTE — Progress Notes (Signed)
Subjective:    Patient ID: Thomas Armstrong, male    DOB: 1970-07-19, 40 y.o.   MRN: [id number]  HPI 40 year old Caucasian male with HIV perfectly suppressed with healthy cd4 count. He returns for routine visit today to was last seen in May if 2011. He claims he has not missed any of his doses of antiretroviral medications. He is using condoms all forms of sexual intercourse with his wife. His wife has remained HIV seronegative cord the patient. He did have a flare of bronchitis in February which is treated with azithromycin and albuterol metered-dose inhaler. He also had a gastroenterologist as an illness in February March rather that was treated with Lomotil. Otherwise he has done quite well. He is to remain tobacco free although he has gained weight in the interim. He is trying to lose weight but having difficulty managing this with exercise and diet. Reticulocyte production his frequent travel. He informed me that he did receive an influenza shot Flu shot in October 2011, at Dr. Loyal Gambler. He always laboratory data today. He had no others specific complaints.  Review of Systems  Constitutional: Negative for fever, chills, diaphoresis, activity change, appetite change, fatigue and unexpected weight change.  HENT: Negative for hearing loss, nosebleeds, congestion, facial swelling, rhinorrhea, sneezing, neck pain, neck stiffness, dental problem, postnasal drip, sinus pressure, tinnitus and ear discharge.   Eyes: Negative for photophobia and visual disturbance.  Respiratory: Negative for apnea, cough, choking, chest tightness, shortness of breath, wheezing and stridor.   Cardiovascular: Negative.   Gastrointestinal: Negative for nausea, abdominal pain, diarrhea, constipation, blood in stool, abdominal distention and anal bleeding.  Genitourinary: Negative for dysuria and flank pain.  Musculoskeletal: Negative for myalgias, back pain, joint swelling, arthralgias and gait problem.  Skin: Negative for  color change, pallor and rash.  Neurological: Negative for dizziness, tremors, weakness, light-headedness, numbness and headaches.  Hematological: Negative for adenopathy. Does not bruise/bleed easily.  Psychiatric/Behavioral: Negative for hallucinations, behavioral problems, confusion, dysphoric mood, decreased concentration and agitation.       Objective:   Physical Exam  Constitutional: He is oriented to person, place, and time. He appears well-developed. No distress.  HENT:  Head: Normocephalic and atraumatic.  Mouth/Throat: Oropharynx is clear and moist. No oropharyngeal exudate.  Eyes: Conjunctivae and EOM are normal. Pupils are equal, round, and reactive to light. No scleral icterus.  Neck: Normal range of motion. Neck supple. No JVD present.  Cardiovascular: Normal rate, regular rhythm and normal heart sounds.  Exam reveals no gallop and no friction rub.   No murmur heard. Pulmonary/Chest: Effort normal and breath sounds normal. No respiratory distress. He has no wheezes. He has no rales. He exhibits no tenderness.  Abdominal: He exhibits no distension. There is no tenderness. There is no rebound and no guarding.  Musculoskeletal: He exhibits no edema and no tenderness.  Lymphadenopathy:    He has no cervical adenopathy.  Neurological: He is alert and oriented to person, place, and time.  Skin: Skin is warm and dry. No rash noted. He is not diaphoretic. No erythema. No pallor.  Psychiatric: He has a normal mood and affect. His behavior is normal. Judgment and thought content normal.          Assessment & Plan:  HIV DISEASE Doing superbly well. Continue Atripla  ALLERGIC RHINITIS, SEASONAL Not active at present. Sounds like he could have a flare in February  WEIGHT GAIN Recommended low carbohydrate diet and exercise  TOBACCO ABUSE, HX OF Has not  relapses, has stopped

## 2010-11-19 NOTE — Patient Instructions (Signed)
GREAT JOB! RTC IN October  COME IN FASTING FOR YOUR LABS (LIPID PANEL ORDERED THIS TIME)

## 2011-03-29 LAB — T-HELPER CELL (CD4) - (RCID CLINIC ONLY): CD4 T Cell Abs: 500

## 2011-04-04 LAB — T-HELPER CELL (CD4) - (RCID CLINIC ONLY): CD4 % Helper T Cell: 46

## 2011-04-09 LAB — T-HELPER CELL (CD4) - (RCID CLINIC ONLY)
CD4 % Helper T Cell: 44
CD4 T Cell Abs: 420

## 2011-10-25 ENCOUNTER — Other Ambulatory Visit: Payer: Self-pay | Admitting: Infectious Disease

## 2011-12-05 ENCOUNTER — Telehealth: Payer: Self-pay | Admitting: *Deleted

## 2011-12-05 NOTE — Telephone Encounter (Signed)
I lm asking him to call & make an appt. Also sent to bridge counsellor

## 2011-12-10 ENCOUNTER — Encounter: Payer: Self-pay | Admitting: *Deleted

## 2011-12-10 NOTE — Telephone Encounter (Signed)
Letter mailed to him asking him to make an appt

## 2012-02-27 ENCOUNTER — Other Ambulatory Visit (INDEPENDENT_AMBULATORY_CARE_PROVIDER_SITE_OTHER): Payer: BC Managed Care – PPO

## 2012-02-27 ENCOUNTER — Other Ambulatory Visit: Payer: Self-pay | Admitting: Infectious Disease

## 2012-02-27 DIAGNOSIS — Z113 Encounter for screening for infections with a predominantly sexual mode of transmission: Secondary | ICD-10-CM

## 2012-02-27 DIAGNOSIS — B20 Human immunodeficiency virus [HIV] disease: Secondary | ICD-10-CM

## 2012-02-27 LAB — LIPID PANEL
HDL: 32 mg/dL — ABNORMAL LOW (ref >39)
LDL Cholesterol: 87 mg/dL (ref 0–99)
Triglycerides: 103 mg/dL (ref <150)

## 2012-02-27 LAB — CBC WITH DIFFERENTIAL/PLATELET
Basophils Absolute: 0 10*3/uL (ref 0.0–0.1)
Basophils Relative: 1 % (ref 0–1)
Eosinophils Relative: 1 % (ref 0–5)
HCT: 44.3 % (ref 39.0–52.0)
MCH: 30.2 pg (ref 26.0–34.0)
MCHC: 34.1 g/dL (ref 30.0–36.0)
MCV: 88.6 fL (ref 78.0–100.0)
Monocytes Absolute: 0.3 10*3/uL (ref 0.1–1.0)
RDW: 15 % (ref 11.5–15.5)

## 2012-02-27 LAB — COMPREHENSIVE METABOLIC PANEL
ALT: 12 U/L (ref 0–53)
CO2: 24 mEq/L (ref 19–32)
Calcium: 9.4 mg/dL (ref 8.4–10.5)
Chloride: 105 mEq/L (ref 96–112)
Creat: 1 mg/dL (ref 0.50–1.35)
Glucose, Bld: 80 mg/dL (ref 70–99)
Total Protein: 7 g/dL (ref 6.0–8.3)

## 2012-02-28 LAB — HIV-1 RNA QUANT-NO REFLEX-BLD: HIV-1 RNA Quant, Log: <1.3 {Log} (ref <1.30)

## 2012-03-12 ENCOUNTER — Ambulatory Visit (INDEPENDENT_AMBULATORY_CARE_PROVIDER_SITE_OTHER): Payer: BC Managed Care – PPO | Admitting: Infectious Disease

## 2012-03-12 ENCOUNTER — Encounter: Payer: Self-pay | Admitting: Infectious Disease

## 2012-03-12 VITALS — BP 118/84 | HR 98 | Ht 73.0 in | Wt 233.0 lb

## 2012-03-12 DIAGNOSIS — Z87891 Personal history of nicotine dependence: Secondary | ICD-10-CM

## 2012-03-12 DIAGNOSIS — B20 Human immunodeficiency virus [HIV] disease: Secondary | ICD-10-CM

## 2012-03-12 NOTE — Assessment & Plan Note (Signed)
Perfect control 

## 2012-03-12 NOTE — Assessment & Plan Note (Signed)
Counseled to stop smoking tobacco

## 2012-03-12 NOTE — Progress Notes (Signed)
  Subjective:    Patient ID: Thomas Armstrong, male    DOB: 09-06-1970, 41 y.o.   MRN: [id number]  HPI  41 year old man doing extremely well on his current antiretroviral regimen of Atripla with an undetectable viral load and healthy CD4 count. He returns to clinic for followup visit after 6 months time. He is absolutely no complaints or concerns at this point in time.   Review of Systems  Constitutional: Negative for fever, chills, diaphoresis, activity change, appetite change, fatigue and unexpected weight change.  HENT: Negative for congestion, sore throat, rhinorrhea, sneezing, trouble swallowing and sinus pressure.   Eyes: Negative for photophobia and visual disturbance.  Respiratory: Negative for cough, chest tightness, shortness of breath, wheezing and stridor.   Cardiovascular: Negative for chest pain, palpitations and leg swelling.  Gastrointestinal: Negative for nausea, vomiting, abdominal pain, diarrhea, constipation, blood in stool, abdominal distention and anal bleeding.  Genitourinary: Negative for dysuria, hematuria, flank pain and difficulty urinating.  Musculoskeletal: Negative for myalgias, back pain, joint swelling, arthralgias and gait problem.  Skin: Negative for color change, pallor, rash and wound.  Neurological: Negative for dizziness, tremors, weakness and light-headedness.  Hematological: Negative for adenopathy. Does not bruise/bleed easily.  Psychiatric/Behavioral: Negative for behavioral problems, confusion, disturbed wake/sleep cycle, dysphoric mood, decreased concentration and agitation.       Objective:   Physical Exam  Constitutional: He is oriented to person, place, and time. He appears well-developed and well-nourished. No distress.  HENT:  Head: Normocephalic and atraumatic.  Mouth/Throat: Oropharynx is clear and moist. No oropharyngeal exudate.  Eyes: Conjunctivae and EOM are normal. Pupils are equal, round, and reactive to light. No scleral icterus.    Neck: Normal range of motion. Neck supple. No JVD present.  Cardiovascular: Normal rate, regular rhythm and normal heart sounds.  Exam reveals no gallop and no friction rub.   No murmur heard. Pulmonary/Chest: Effort normal and breath sounds normal. No respiratory distress. He has no wheezes. He has no rales. He exhibits no tenderness.  Abdominal: He exhibits no distension and no mass. There is no tenderness. There is no rebound and no guarding.  Musculoskeletal: He exhibits no edema and no tenderness.  Lymphadenopathy:    He has no cervical adenopathy.  Neurological: He is alert and oriented to person, place, and time. He has normal reflexes. He exhibits normal muscle tone. Coordination normal.  Skin: Skin is warm and dry. He is not diaphoretic. No erythema. No pallor.  Psychiatric: He has a normal mood and affect. His behavior is normal. Judgment and thought content normal.          Assessment & Plan:  HIV DISEASE Perfect control  TOBACCO ABUSE, HX OF Counseled to stop smoking tobacco

## 2012-07-31 ENCOUNTER — Other Ambulatory Visit: Payer: Self-pay | Admitting: Infectious Disease

## 2012-07-31 DIAGNOSIS — B20 Human immunodeficiency virus [HIV] disease: Secondary | ICD-10-CM

## 2012-08-26 ENCOUNTER — Other Ambulatory Visit: Payer: BC Managed Care – PPO

## 2012-08-27 ENCOUNTER — Other Ambulatory Visit: Payer: BC Managed Care – PPO

## 2012-08-27 DIAGNOSIS — B20 Human immunodeficiency virus [HIV] disease: Secondary | ICD-10-CM

## 2012-08-27 LAB — RPR

## 2012-08-27 LAB — CBC WITH DIFFERENTIAL/PLATELET
Basophils Absolute: 0 10*3/uL (ref 0.0–0.1)
Basophils Relative: 0 % (ref 0–1)
Eosinophils Absolute: 0.1 10*3/uL (ref 0.0–0.7)
Lymphs Abs: 1 10*3/uL (ref 0.7–4.0)
MCH: 30.8 pg (ref 26.0–34.0)
Neutrophils Relative %: 70 % (ref 43–77)
Platelets: 277 10*3/uL (ref 150–400)
RBC: 5.33 MIL/uL (ref 4.22–5.81)
RDW: 14.6 % (ref 11.5–15.5)

## 2012-08-27 LAB — LIPID PANEL
Cholesterol: 177 mg/dL (ref 0–200)
HDL: 41 mg/dL (ref >39)
Total CHOL/HDL Ratio: 4.3 Ratio
VLDL: 25 mg/dL (ref 0–40)

## 2012-08-28 LAB — COMPLETE METABOLIC PANEL WITH GFR
ALT: 14 U/L (ref 0–53)
AST: 12 U/L (ref 0–37)
Alkaline Phosphatase: 67 U/L (ref 39–117)
CO2: 23 mEq/L (ref 19–32)
Creat: 1.04 mg/dL (ref 0.50–1.35)
Sodium: 139 mEq/L (ref 135–145)
Total Bilirubin: 0.3 mg/dL (ref 0.3–1.2)
Total Protein: 7.2 g/dL (ref 6.0–8.3)

## 2012-08-28 LAB — T-HELPER CELL (CD4) - (RCID CLINIC ONLY)
CD4 % Helper T Cell: 49 % (ref 33–55)
CD4 T Cell Abs: 520 uL (ref 400–2700)

## 2012-08-28 LAB — HIV-1 RNA QUANT-NO REFLEX-BLD: HIV-1 RNA Quant, Log: <1.3 {Log} (ref <1.30)

## 2012-09-09 ENCOUNTER — Ambulatory Visit: Payer: BC Managed Care – PPO | Admitting: Infectious Disease

## 2012-09-16 ENCOUNTER — Ambulatory Visit: Payer: BC Managed Care – PPO | Admitting: Infectious Disease

## 2012-09-23 ENCOUNTER — Encounter: Payer: Self-pay | Admitting: Infectious Disease

## 2012-09-23 ENCOUNTER — Ambulatory Visit (INDEPENDENT_AMBULATORY_CARE_PROVIDER_SITE_OTHER): Payer: BC Managed Care – PPO | Admitting: Infectious Disease

## 2012-09-23 VITALS — BP 134/91 | HR 87 | Temp 97.8°F | Wt 238.0 lb

## 2012-09-23 DIAGNOSIS — I10 Essential (primary) hypertension: Secondary | ICD-10-CM

## 2012-09-23 DIAGNOSIS — E669 Obesity, unspecified: Secondary | ICD-10-CM

## 2012-09-23 DIAGNOSIS — B20 Human immunodeficiency virus [HIV] disease: Secondary | ICD-10-CM

## 2012-09-23 DIAGNOSIS — Z23 Encounter for immunization: Secondary | ICD-10-CM

## 2012-09-23 NOTE — Progress Notes (Signed)
  Subjective:    Patient ID: Thomas Armstrong, male    DOB: Oct 21, 1970, 42 y.o.   MRN: [id number]  HPI   42 year old man doing extremely well on his current antiretroviral regimen of Atripla with an undetectable viral load and healthy CD4 count. He returns to clinic for followup visit after 6 months time. He is absolutely no complaints or concerns at this point in time. We reviewed any possible se from [place] and he denied all such symptoms   Review of Systems  Constitutional: Negative for fever, chills, diaphoresis, activity change, appetite change, fatigue and unexpected weight change.  HENT: Negative for congestion, sore throat, rhinorrhea, sneezing, trouble swallowing and sinus pressure.   Eyes: Negative for photophobia and visual disturbance.  Respiratory: Negative for cough, chest tightness, shortness of breath, wheezing and stridor.   Cardiovascular: Negative for chest pain, palpitations and leg swelling.  Gastrointestinal: Negative for nausea, vomiting, abdominal pain, diarrhea, constipation, blood in stool, abdominal distention and anal bleeding.  Genitourinary: Negative for dysuria, hematuria, flank pain and difficulty urinating.  Musculoskeletal: Negative for myalgias, back pain, joint swelling, arthralgias and gait problem.  Skin: Negative for color change, pallor, rash and wound.  Neurological: Negative for dizziness, tremors, weakness and light-headedness.  Hematological: Negative for adenopathy. Does not bruise/bleed easily.  Psychiatric/Behavioral: Negative for behavioral problems, confusion, sleep disturbance, dysphoric mood, decreased concentration and agitation.       Objective:   Physical Exam  Constitutional: He is oriented to person, place, and time. He appears well-developed and well-nourished. No distress.  HENT:  Head: Normocephalic and atraumatic.  Mouth/Throat: Oropharynx is clear and moist. No oropharyngeal exudate.  Eyes: Conjunctivae and EOM are normal.  Pupils are equal, round, and reactive to light. No scleral icterus.  Neck: Normal range of motion. Neck supple. No JVD present.  Cardiovascular: Normal rate, regular rhythm and normal heart sounds.  Exam reveals no gallop and no friction rub.   No murmur heard. Pulmonary/Chest: Effort normal and breath sounds normal. No respiratory distress. He has no wheezes. He has no rales. He exhibits no tenderness.  Abdominal: He exhibits no distension and no mass. There is no tenderness. There is no rebound and no guarding.  Musculoskeletal: He exhibits no edema and no tenderness.  Lymphadenopathy:    He has no cervical adenopathy.  Neurological: He is alert and oriented to person, place, and time. He has normal reflexes. He exhibits normal muscle tone. Coordination normal.  Skin: Skin is warm and dry. He is not diaphoretic. No erythema. No pallor.  Psychiatric: He has a normal mood and affect. His behavior is normal. Judgment and thought content normal.          Assessment & Plan:  HIV: perfect control  HTN: slightly up likely due to white coat vs weight gain  Weight gain. He is going back on exercise program

## 2013-03-22 ENCOUNTER — Other Ambulatory Visit: Payer: BC Managed Care – PPO

## 2013-03-23 ENCOUNTER — Other Ambulatory Visit: Payer: BC Managed Care – PPO

## 2013-03-23 DIAGNOSIS — Z113 Encounter for screening for infections with a predominantly sexual mode of transmission: Secondary | ICD-10-CM

## 2013-03-23 DIAGNOSIS — B20 Human immunodeficiency virus [HIV] disease: Secondary | ICD-10-CM

## 2013-03-23 LAB — COMPLETE METABOLIC PANEL WITH GFR
AST: 17 U/L (ref 0–37)
Albumin: 4.8 g/dL (ref 3.5–5.2)
Alkaline Phosphatase: 64 U/L (ref 39–117)
BUN: 13 mg/dL (ref 6–23)
Creat: 1.01 mg/dL (ref 0.50–1.35)
GFR, Est Non African American: >89 mL/min
Glucose, Bld: 84 mg/dL (ref 70–99)
Potassium: 4.2 mEq/L (ref 3.5–5.3)

## 2013-03-23 LAB — LIPID PANEL
HDL: 37 mg/dL — ABNORMAL LOW (ref >39)
Total CHOL/HDL Ratio: 5.2 Ratio
Triglycerides: 192 mg/dL — ABNORMAL HIGH (ref <150)

## 2013-03-23 LAB — CBC WITH DIFFERENTIAL/PLATELET
Basophils Absolute: 0 10*3/uL (ref 0.0–0.1)
HCT: 49.6 % (ref 39.0–52.0)
Lymphocytes Relative: 24 % (ref 12–46)
Lymphs Abs: 1.2 10*3/uL (ref 0.7–4.0)
MCV: 90.2 fL (ref 78.0–100.0)
Monocytes Absolute: 0.4 10*3/uL (ref 0.1–1.0)
Neutro Abs: 3.3 10*3/uL (ref 1.7–7.7)
Platelets: 268 10*3/uL (ref 150–400)
RBC: 5.5 MIL/uL (ref 4.22–5.81)
RDW: 15.2 % (ref 11.5–15.5)
WBC: 5 10*3/uL (ref 4.0–10.5)

## 2013-03-23 LAB — RPR

## 2013-03-24 LAB — HIV-1 RNA QUANT-NO REFLEX-BLD: HIV 1 RNA Quant: 22 copies/mL — ABNORMAL HIGH (ref <20)

## 2013-04-05 ENCOUNTER — Ambulatory Visit: Payer: BC Managed Care – PPO | Admitting: Infectious Disease

## 2013-04-07 ENCOUNTER — Ambulatory Visit (INDEPENDENT_AMBULATORY_CARE_PROVIDER_SITE_OTHER): Payer: BC Managed Care – PPO | Admitting: Infectious Disease

## 2013-04-07 ENCOUNTER — Encounter: Payer: Self-pay | Admitting: Infectious Disease

## 2013-04-07 VITALS — BP 126/87 | HR 82 | Temp 98.4°F | Ht 74.0 in | Wt 245.0 lb

## 2013-04-07 DIAGNOSIS — Z23 Encounter for immunization: Secondary | ICD-10-CM

## 2013-04-07 DIAGNOSIS — I10 Essential (primary) hypertension: Secondary | ICD-10-CM

## 2013-04-07 DIAGNOSIS — B20 Human immunodeficiency virus [HIV] disease: Secondary | ICD-10-CM

## 2013-04-07 NOTE — Progress Notes (Signed)
  Subjective:    Patient ID: Thomas Armstrong, male    DOB: 09-03-1970, 42 y.o.   MRN: [id number]  HPI   42 year-old man doing extremely well on his current antiretroviral regimen of Atripla with an undetectable viral load and healthy CD4 count. He returns to clinic for followup visit after 6 months time.   ON close review he states that he previously was placedon ADDeral due to difficulty with concentration and that this occurred while he was on Atripla. I recommended changing him to less psychoactive drug such as Complera, [name] or [name]. He has had HIV x 20 years but I do not know all of his treatment regimens. He does not believe he ever had resistance with failure.    Review of Systems  Constitutional: Negative for fever, chills, diaphoresis, activity change, appetite change, fatigue and unexpected weight change.  HENT: Negative for congestion, sore throat, rhinorrhea, sneezing, trouble swallowing and sinus pressure.   Eyes: Negative for photophobia and visual disturbance.  Respiratory: Negative for cough, chest tightness, shortness of breath, wheezing and stridor.   Cardiovascular: Negative for chest pain, palpitations and leg swelling.  Gastrointestinal: Negative for nausea, vomiting, abdominal pain, diarrhea, constipation, blood in stool, abdominal distention and anal bleeding.  Genitourinary: Negative for dysuria, hematuria, flank pain and difficulty urinating.  Musculoskeletal: Negative for myalgias, back pain, joint swelling, arthralgias and gait problem.  Skin: Negative for color change, pallor, rash and wound.  Neurological: Negative for dizziness, tremors, weakness and light-headedness.  Hematological: Negative for adenopathy. Does not bruise/bleed easily.  Psychiatric/Behavioral: Negative for behavioral problems, confusion, sleep disturbance, dysphoric mood, decreased concentration and agitation.       Objective:   Physical Exam  Constitutional: He is oriented to person,  place, and time. He appears well-developed and well-nourished. No distress.  HENT:  Head: Normocephalic and atraumatic.  Mouth/Throat: Oropharynx is clear and moist. No oropharyngeal exudate.  Eyes: Conjunctivae and EOM are normal. Pupils are equal, round, and reactive to light. No scleral icterus.  Neck: Normal range of motion. Neck supple. No JVD present.  Cardiovascular: Normal rate, regular rhythm and normal heart sounds.  Exam reveals no gallop and no friction rub.   No murmur heard. Pulmonary/Chest: Effort normal and breath sounds normal. No respiratory distress. He has no wheezes. He has no rales. He exhibits no tenderness.  Abdominal: He exhibits no distension and no mass. There is no tenderness. There is no rebound and no guarding.  Musculoskeletal: He exhibits no edema and no tenderness.  Lymphadenopathy:    He has no cervical adenopathy.  Neurological: He is alert and oriented to person, place, and time. He has normal reflexes. He exhibits normal muscle tone. Coordination normal.  Skin: Skin is warm and dry. He is not diaphoretic. No erythema. No pallor.  Psychiatric: He has a normal mood and affect. His behavior is normal. Judgment and thought content normal.          Assessment & Plan:  HIV: perfect control, check HLA b701, review his paper chart. IF hLA B701 negative and NO suggestion of R to ABC, 3tc will put him on [name]. If either POS for HLA or ? Possible resistance would go with [name] given greater liklihood of TRuvada being more potent in Rx experienced pt  HTN: slightly up likely due to white coat vs weight gain, stable    HCM: flu shot

## 2013-04-13 LAB — HLA B*5701

## 2013-04-14 ENCOUNTER — Telehealth: Payer: Self-pay | Admitting: Infectious Disease

## 2013-04-14 NOTE — Telephone Encounter (Signed)
I need to have Thomas Armstrong's paper chart pulled to consider ?  Could change him to [name] vs [name]. Neeed to see what other drugs he was on in past any virological failure

## 2013-05-27 ENCOUNTER — Other Ambulatory Visit: Payer: BC Managed Care – PPO

## 2013-06-09 ENCOUNTER — Ambulatory Visit: Payer: BC Managed Care – PPO | Admitting: Infectious Disease

## 2013-10-27 ENCOUNTER — Other Ambulatory Visit: Payer: Self-pay | Admitting: Infectious Disease

## 2013-10-27 NOTE — Telephone Encounter (Signed)
Patient needs appt

## 2013-11-22 ENCOUNTER — Other Ambulatory Visit: Payer: Self-pay | Admitting: Infectious Disease

## 2013-11-22 ENCOUNTER — Other Ambulatory Visit: Payer: Self-pay | Admitting: Licensed Clinical Social Worker

## 2013-11-22 MED ORDER — EFAVIRENZ-EMTRICITAB-TENOFOVIR 600-200-300 MG PO TABS: ORAL_TABLET | ORAL | Status: DC

## 2013-12-14 ENCOUNTER — Ambulatory Visit: Payer: BC Managed Care – PPO

## 2013-12-14 DIAGNOSIS — B20 Human immunodeficiency virus [HIV] disease: Secondary | ICD-10-CM

## 2013-12-14 LAB — COMPLETE METABOLIC PANEL WITH GFR
ALT: 22 U/L (ref 0–53)
AST: 19 U/L (ref 0–37)
Albumin: 4.5 g/dL (ref 3.5–5.2)
Alkaline Phosphatase: 54 U/L (ref 39–117)
BUN: 13 mg/dL (ref 6–23)
CHLORIDE: 104 meq/L (ref 96–112)
CO2: 24 meq/L (ref 19–32)
Calcium: 9.5 mg/dL (ref 8.4–10.5)
Creat: 1.06 mg/dL (ref 0.50–1.35)
GFR, Est Non African American: 87 mL/min
Glucose, Bld: 80 mg/dL (ref 70–99)
POTASSIUM: 4.4 meq/L (ref 3.5–5.3)
SODIUM: 139 meq/L (ref 135–145)
TOTAL PROTEIN: 7.3 g/dL (ref 6.0–8.3)
Total Bilirubin: 0.4 mg/dL (ref 0.2–1.2)

## 2013-12-14 LAB — CBC WITH DIFFERENTIAL/PLATELET
Basophils Absolute: 0.1 10*3/uL (ref 0.0–0.1)
Basophils Relative: 1 % (ref 0–1)
Eosinophils Absolute: 0.1 10*3/uL (ref 0.0–0.7)
Eosinophils Relative: 2 % (ref 0–5)
HCT: 46.8 % (ref 39.0–52.0)
Hemoglobin: 16.3 g/dL (ref 13.0–17.0)
LYMPHS ABS: 1.3 10*3/uL (ref 0.7–4.0)
Lymphocytes Relative: 23 % (ref 12–46)
MCH: 30.9 pg (ref 26.0–34.0)
MCHC: 34.8 g/dL (ref 30.0–36.0)
MCV: 88.6 fL (ref 78.0–100.0)
Monocytes Absolute: 0.4 10*3/uL (ref 0.1–1.0)
Monocytes Relative: 7 % (ref 3–12)
NEUTROS PCT: 67 % (ref 43–77)
Neutro Abs: 3.8 10*3/uL (ref 1.7–7.7)
PLATELETS: 259 10*3/uL (ref 150–400)
RBC: 5.28 MIL/uL (ref 4.22–5.81)
RDW: 14.6 % (ref 11.5–15.5)
WBC: 5.6 10*3/uL (ref 4.0–10.5)

## 2013-12-15 LAB — T-HELPER CELL (CD4) - (RCID CLINIC ONLY)
CD4 % Helper T Cell: 51 % (ref 33–55)
CD4 T Cell Abs: 700 /uL (ref 400–2700)

## 2013-12-15 LAB — HIV-1 RNA QUANT-NO REFLEX-BLD
HIV 1 RNA Quant: <20 copies/mL (ref <20)
HIV-1 RNA Quant, Log: <1.3 {Log} (ref <1.30)

## 2013-12-27 ENCOUNTER — Ambulatory Visit: Payer: BC Managed Care – PPO | Admitting: Infectious Disease

## 2013-12-30 ENCOUNTER — Other Ambulatory Visit: Payer: Self-pay | Admitting: Licensed Clinical Social Worker

## 2013-12-30 ENCOUNTER — Encounter: Payer: Self-pay | Admitting: Infectious Disease

## 2013-12-30 MED ORDER — EFAVIRENZ-EMTRICITAB-TENOFOVIR 600-200-300 MG PO TABS: 1.0000 | ORAL_TABLET | Freq: Every day | ORAL | Status: DC

## 2014-01-05 ENCOUNTER — Ambulatory Visit: Payer: BC Managed Care – PPO | Admitting: Infectious Disease

## 2014-01-06 ENCOUNTER — Ambulatory Visit: Payer: BC Managed Care – PPO | Admitting: Infectious Disease

## 2014-01-21 ENCOUNTER — Other Ambulatory Visit: Payer: Self-pay | Admitting: Infectious Disease

## 2014-01-21 DIAGNOSIS — B20 Human immunodeficiency virus [HIV] disease: Secondary | ICD-10-CM

## 2014-02-09 ENCOUNTER — Encounter: Payer: Self-pay | Admitting: Infectious Disease

## 2014-02-09 ENCOUNTER — Ambulatory Visit (INDEPENDENT_AMBULATORY_CARE_PROVIDER_SITE_OTHER): Payer: BC Managed Care – PPO | Admitting: Infectious Disease

## 2014-02-09 VITALS — BP 129/95 | HR 83 | Temp 98.6°F | Ht 74.0 in | Wt 246.0 lb

## 2014-02-09 DIAGNOSIS — Z79899 Other long term (current) drug therapy: Secondary | ICD-10-CM | POA: Diagnosis not present

## 2014-02-09 DIAGNOSIS — I10 Essential (primary) hypertension: Secondary | ICD-10-CM

## 2014-02-09 DIAGNOSIS — Z113 Encounter for screening for infections with a predominantly sexual mode of transmission: Secondary | ICD-10-CM

## 2014-02-09 DIAGNOSIS — B20 Human immunodeficiency virus [HIV] disease: Secondary | ICD-10-CM | POA: Diagnosis not present

## 2014-02-09 LAB — RPR

## 2014-02-09 MED ORDER — ELVITEG-COBIC-EMTRICIT-TENOFDF 150-150-200-300 MG PO TABS: 1.0000 | ORAL_TABLET | Freq: Every day | ORAL | Status: DC

## 2014-02-09 NOTE — Progress Notes (Signed)
  Subjective:    Patient ID: Thomas Armstrong, male    DOB: September 28, 1970, 43 y.o.   MRN: [id number]  HPI   43 year-old man doing extremely well on his current antiretroviral regimen of Atripla with an undetectable viral load and healthy CD4 count. He returns to clinic for followup visit after 6 months time.   We had discussed change to other ARV regimens and I will plan on changing him to [facility] after he finishes his next round of Atripla (3 months time)   Review of Systems  Constitutional: Negative for fever, chills, diaphoresis, activity change, appetite change, fatigue and unexpected weight change.  HENT: Negative for congestion, rhinorrhea, sinus pressure, sneezing, sore throat and trouble swallowing.   Eyes: Negative for photophobia and visual disturbance.  Respiratory: Negative for cough, chest tightness, shortness of breath, wheezing and stridor.   Cardiovascular: Negative for chest pain, palpitations and leg swelling.  Gastrointestinal: Negative for nausea, vomiting, abdominal pain, diarrhea, constipation, blood in stool, abdominal distention and anal bleeding.  Genitourinary: Negative for dysuria, hematuria, flank pain and difficulty urinating.  Musculoskeletal: Negative for arthralgias, back pain, gait problem, joint swelling and myalgias.  Skin: Negative for color change, pallor, rash and wound.  Neurological: Negative for dizziness, tremors, weakness and light-headedness.  Hematological: Negative for adenopathy. Does not bruise/bleed easily.  Psychiatric/Behavioral: Negative for behavioral problems, confusion, sleep disturbance, dysphoric mood, decreased concentration and agitation.       Objective:   Physical Exam  Constitutional: He is oriented to person, place, and time. He appears well-developed and well-nourished. No distress.  HENT:  Head: Normocephalic and atraumatic.  Mouth/Throat: Oropharynx is clear and moist. No oropharyngeal exudate.  Eyes: Conjunctivae and EOM  are normal. Pupils are equal, round, and reactive to light. No scleral icterus.  Neck: Normal range of motion. Neck supple. No JVD present.  Cardiovascular: Normal rate, regular rhythm and normal heart sounds.  Exam reveals no gallop and no friction rub.   No murmur heard. Pulmonary/Chest: Effort normal and breath sounds normal. No respiratory distress. He has no wheezes. He has no rales. He exhibits no tenderness.  Abdominal: He exhibits no distension and no mass. There is no tenderness. There is no rebound and no guarding.  Musculoskeletal: He exhibits no edema and no tenderness.  Lymphadenopathy:    He has no cervical adenopathy.  Neurological: He is alert and oriented to person, place, and time. He has normal reflexes. He exhibits normal muscle tone. Coordination normal.  Skin: Skin is warm and dry. He is not diaphoretic. No erythema. No pallor.  Psychiatric: He has a normal mood and affect. His behavior is normal. Judgment and thought content normal.          Assessment & Plan:  HIV: perfect control. HLA b701 is negative and will check genosure archive test. Plan to switch to [facility] but if no sig R could also consider [name] in future I spent greater than 25 minutes with the patient including greater than 50% of time in face to face counsel of the patient and in coordination of their care.   HTN: better   CV risk: FRS would appear to be low. Will alert Herschell Dimes to consider him for REPRIEVE

## 2014-03-02 ENCOUNTER — Telehealth: Payer: Self-pay | Admitting: Infectious Disease

## 2014-03-02 MED ORDER — EFAVIRENZ-EMTRICITAB-TENOFOVIR 600-200-300 MG PO TABS: 1.0000 | ORAL_TABLET | Freq: Every day | ORAL | Status: DC

## 2014-03-02 NOTE — Telephone Encounter (Signed)
         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  ALSO MORE IMPORTANTLY CAN YOU LOOK INTO WHETHER OK TO GIVE [place] (IE DTG 50MG  ONCE DAILY IN THE FACE OF THIS MUTATION) IT APPEARS TO ME TO BE THE CASE

## 2014-03-02 NOTE — Telephone Encounter (Signed)
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

## 2014-03-29 ENCOUNTER — Encounter: Payer: Self-pay | Admitting: Infectious Disease

## 2014-04-22 ENCOUNTER — Other Ambulatory Visit: Payer: Self-pay

## 2014-09-05 ENCOUNTER — Encounter: Payer: Self-pay | Admitting: Infectious Disease

## 2014-09-12 ENCOUNTER — Telehealth: Payer: Self-pay | Admitting: *Deleted

## 2014-09-12 NOTE — Telephone Encounter (Signed)
I called [employer] concerning the 02-09-14 bill Thomas Armstrong received from [facility] for $1,068.00 for a genotype analysis.  I spoke with [employer].  He said Thomas Armstrong was sent a check for $248.67 on 08-26-14.  The balance was non-covered.  I called Thomas Armstrong and told him what was said.  He said he had received the check and wanted to know about the balance.  I told him that according to [facility] it was non-covered and he needed to call [employer] and find out if he owes it or not.

## 2014-09-14 ENCOUNTER — Telehealth: Payer: Self-pay | Admitting: *Deleted

## 2014-09-14 NOTE — Telephone Encounter (Signed)
Called Mr. Kerstein back.  I told him that the charge was covered by [facility].  I also him if he had a copy of the EOB.  He said no.  I told him he needs to call [employer] and request a copy of the EOB for [facility] 02-09-14.

## 2014-11-07 ENCOUNTER — Encounter: Payer: Self-pay | Admitting: Infectious Disease

## 2014-11-15 ENCOUNTER — Other Ambulatory Visit: Payer: BLUE CROSS/BLUE SHIELD

## 2014-11-15 DIAGNOSIS — B20 Human immunodeficiency virus [HIV] disease: Secondary | ICD-10-CM

## 2014-11-15 DIAGNOSIS — Z113 Encounter for screening for infections with a predominantly sexual mode of transmission: Secondary | ICD-10-CM

## 2014-11-15 DIAGNOSIS — Z79899 Other long term (current) drug therapy: Secondary | ICD-10-CM

## 2014-11-15 LAB — CBC WITH DIFFERENTIAL/PLATELET
Basophils Absolute: 0.1 10*3/uL (ref 0.0–0.1)
Basophils Relative: 1 % (ref 0–1)
Eosinophils Absolute: 0.2 10*3/uL (ref 0.0–0.7)
Eosinophils Relative: 3 % (ref 0–5)
HCT: 47.6 % (ref 39.0–52.0)
HEMOGLOBIN: 16.6 g/dL (ref 13.0–17.0)
Lymphocytes Relative: 24 % (ref 12–46)
Lymphs Abs: 1.6 10*3/uL (ref 0.7–4.0)
MCH: 31.5 pg (ref 26.0–34.0)
MCHC: 34.9 g/dL (ref 30.0–36.0)
MCV: 90.3 fL (ref 78.0–100.0)
MONOS PCT: 6 % (ref 3–12)
MPV: 9.8 fL (ref 8.6–12.4)
Monocytes Absolute: 0.4 10*3/uL (ref 0.1–1.0)
NEUTROS ABS: 4.3 10*3/uL (ref 1.7–7.7)
Neutrophils Relative %: 66 % (ref 43–77)
Platelets: 278 10*3/uL (ref 150–400)
RBC: 5.27 MIL/uL (ref 4.22–5.81)
RDW: 14.7 % (ref 11.5–15.5)
WBC: 6.5 10*3/uL (ref 4.0–10.5)

## 2014-11-15 LAB — COMPLETE METABOLIC PANEL WITH GFR
ALT: 19 U/L (ref 0–53)
AST: 15 U/L (ref 0–37)
Albumin: 4.2 g/dL (ref 3.5–5.2)
Alkaline Phosphatase: 62 U/L (ref 39–117)
BUN: 15 mg/dL (ref 6–23)
CALCIUM: 9.4 mg/dL (ref 8.4–10.5)
CHLORIDE: 105 meq/L (ref 96–112)
CO2: 22 mEq/L (ref 19–32)
CREATININE: 1.05 mg/dL (ref 0.50–1.35)
GFR, Est African American: >89 mL/min
GFR, Est Non African American: 88 mL/min
Glucose, Bld: 120 mg/dL — ABNORMAL HIGH (ref 70–99)
POTASSIUM: 4 meq/L (ref 3.5–5.3)
SODIUM: 139 meq/L (ref 135–145)
Total Bilirubin: 0.2 mg/dL (ref 0.2–1.2)
Total Protein: 6.8 g/dL (ref 6.0–8.3)

## 2014-11-15 LAB — LIPID PANEL
Cholesterol: 182 mg/dL (ref 0–200)
HDL: 29 mg/dL — ABNORMAL LOW (ref >=40)
LDL CALC: 78 mg/dL (ref 0–99)
Total CHOL/HDL Ratio: 6.3 Ratio
Triglycerides: 373 mg/dL — ABNORMAL HIGH (ref <150)
VLDL: 75 mg/dL — AB (ref 0–40)

## 2014-11-16 LAB — URINE CYTOLOGY ANCILLARY ONLY
Chlamydia: NEGATIVE
NEISSERIA GONORRHEA: NEGATIVE

## 2014-11-16 LAB — MICROALBUMIN / CREATININE URINE RATIO
CREATININE, URINE: 182.1 mg/dL
MICROALB UR: 2.6 mg/dL — AB (ref <2.0)
MICROALB/CREAT RATIO: 14.3 mg/g (ref 0.0–30.0)

## 2014-11-16 LAB — RPR

## 2014-11-17 LAB — T-HELPER CELL (CD4) - (RCID CLINIC ONLY)
CD4 % Helper T Cell: 56 % — ABNORMAL HIGH (ref 33–55)
CD4 T CELL ABS: 900 /uL (ref 400–2700)

## 2014-11-17 LAB — HIV-1 RNA QUANT-NO REFLEX-BLD
HIV 1 RNA Quant: <20 copies/mL (ref <20)
HIV-1 RNA Quant, Log: <1.3 {Log} (ref <1.30)

## 2014-11-29 ENCOUNTER — Ambulatory Visit (INDEPENDENT_AMBULATORY_CARE_PROVIDER_SITE_OTHER): Payer: BLUE CROSS/BLUE SHIELD | Admitting: Infectious Disease

## 2014-11-29 ENCOUNTER — Encounter: Payer: Self-pay | Admitting: Infectious Disease

## 2014-11-29 VITALS — BP 127/84 | HR 106 | Temp 99.0°F | Ht 74.0 in | Wt 258.0 lb

## 2014-11-29 DIAGNOSIS — B20 Human immunodeficiency virus [HIV] disease: Secondary | ICD-10-CM

## 2014-11-29 DIAGNOSIS — Z87891 Personal history of nicotine dependence: Secondary | ICD-10-CM

## 2014-11-29 DIAGNOSIS — I10 Essential (primary) hypertension: Secondary | ICD-10-CM | POA: Diagnosis not present
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
## 2014-11-29 NOTE — Patient Instructions (Signed)
The two other ONE PILL ONCE A DAY regimens to consider are :  [name] and ODEFSEY   I think that your HIV GENOSURE FROM [facility] is likely IN ERROR or represents someone else's sample that was tested as there is NO way that you could have Integrase Resistance without ever having been on an integrase strand transfer inhibitor and you are controlled on Atripla

## 2015-02-01 ENCOUNTER — Encounter: Payer: Self-pay | Admitting: Licensed Clinical Social Worker

## 2015-02-01 ENCOUNTER — Encounter: Payer: Self-pay | Admitting: Infectious Disease

## 2015-03-02 ENCOUNTER — Encounter: Payer: Self-pay | Admitting: Infectious Disease

## 2015-04-04 ENCOUNTER — Encounter: Payer: Self-pay | Admitting: Infectious Disease

## 2015-04-12 ENCOUNTER — Other Ambulatory Visit: Payer: Self-pay | Admitting: Infectious Disease

## 2015-07-13 ENCOUNTER — Other Ambulatory Visit: Payer: Self-pay | Admitting: *Deleted

## 2015-07-13 ENCOUNTER — Encounter: Payer: Self-pay | Admitting: Infectious Disease

## 2015-07-13 MED ORDER — EFAVIRENZ-EMTRICITAB-TENOFOVIR 600-200-300 MG PO TABS: 1.0000 | ORAL_TABLET | Freq: Every day | ORAL | Status: DC

## 2015-10-02 ENCOUNTER — Encounter: Payer: Self-pay | Admitting: Infectious Disease

## 2015-10-02 ENCOUNTER — Other Ambulatory Visit: Payer: Self-pay | Admitting: *Deleted

## 2015-10-02 DIAGNOSIS — B20 Human immunodeficiency virus [HIV] disease: Secondary | ICD-10-CM

## 2015-10-02 MED ORDER — EFAVIRENZ-EMTRICITAB-TENOFOVIR 600-200-300 MG PO TABS: 1.0000 | ORAL_TABLET | Freq: Every day | ORAL | Status: DC

## 2015-11-23 DIAGNOSIS — J019 Acute sinusitis, unspecified: Secondary | ICD-10-CM | POA: Diagnosis not present

## 2016-01-11 ENCOUNTER — Other Ambulatory Visit: Payer: Self-pay | Admitting: *Deleted

## 2016-01-11 ENCOUNTER — Other Ambulatory Visit: Payer: Self-pay | Admitting: Infectious Disease

## 2016-01-11 DIAGNOSIS — B20 Human immunodeficiency virus [HIV] disease: Secondary | ICD-10-CM

## 2016-01-11 MED ORDER — EFAVIRENZ-EMTRICITAB-TENOFOVIR 600-200-300 MG PO TABS: 1.0000 | ORAL_TABLET | Freq: Every day | ORAL | Status: DC

## 2016-02-07 ENCOUNTER — Other Ambulatory Visit: Payer: BLUE CROSS/BLUE SHIELD

## 2016-02-07 DIAGNOSIS — B20 Human immunodeficiency virus [HIV] disease: Secondary | ICD-10-CM

## 2016-02-07 LAB — CBC WITH DIFFERENTIAL/PLATELET
BASOS PCT: 0 %
Basophils Absolute: 0 cells/uL (ref 0–200)
Eosinophils Absolute: 152 cells/uL (ref 15–500)
Eosinophils Relative: 2 %
HCT: 47.7 % (ref 38.5–50.0)
Hemoglobin: 16 g/dL (ref 13.2–17.1)
LYMPHS PCT: 19 %
Lymphs Abs: 1444 cells/uL (ref 850–3900)
MCH: 30.4 pg (ref 27.0–33.0)
MCHC: 33.5 g/dL (ref 32.0–36.0)
MCV: 90.7 fL (ref 80.0–100.0)
MONOS PCT: 6 %
MPV: 9.8 fL (ref 7.5–12.5)
Monocytes Absolute: 456 cells/uL (ref 200–950)
NEUTROS PCT: 73 %
Neutro Abs: 5548 cells/uL (ref 1500–7800)
PLATELETS: 288 10*3/uL (ref 140–400)
RBC: 5.26 MIL/uL (ref 4.20–5.80)
RDW: 15.2 % — AB (ref 11.0–15.0)
WBC: 7.6 10*3/uL (ref 3.8–10.8)

## 2016-02-08 LAB — COMPLETE METABOLIC PANEL WITH GFR
ALT: 19 U/L (ref 9–46)
AST: 17 U/L (ref 10–40)
Albumin: 4.2 g/dL (ref 3.6–5.1)
Alkaline Phosphatase: 67 U/L (ref 40–115)
BUN: 13 mg/dL (ref 7–25)
CHLORIDE: 104 mmol/L (ref 98–110)
CO2: 24 mmol/L (ref 20–31)
CREATININE: 1.04 mg/dL (ref 0.60–1.35)
Calcium: 9.4 mg/dL (ref 8.6–10.3)
GFR, Est Non African American: 88 mL/min (ref >=60)
Glucose, Bld: 86 mg/dL (ref 65–99)
POTASSIUM: 3.8 mmol/L (ref 3.5–5.3)
Sodium: 139 mmol/L (ref 135–146)
Total Bilirubin: 0.3 mg/dL (ref 0.2–1.2)
Total Protein: 6.8 g/dL (ref 6.1–8.1)

## 2016-02-08 LAB — LIPID PANEL
CHOL/HDL RATIO: 5.1 ratio — AB (ref <=5.0)
CHOLESTEROL: 173 mg/dL (ref 125–200)
HDL: 34 mg/dL — ABNORMAL LOW (ref >=40)
LDL CALC: 81 mg/dL (ref <130)
TRIGLYCERIDES: 289 mg/dL — AB (ref <150)
VLDL: 58 mg/dL — AB (ref <30)

## 2016-02-08 LAB — URINE CYTOLOGY ANCILLARY ONLY
CHLAMYDIA, DNA PROBE: NEGATIVE
NEISSERIA GONORRHEA: NEGATIVE

## 2016-02-08 LAB — RPR

## 2016-02-09 LAB — HIV-1 RNA QUANT-NO REFLEX-BLD: HIV-1 RNA Quant, Log: <1.3 Log copies/mL (ref <1.30)

## 2016-02-09 LAB — T-HELPER CELL (CD4) - (RCID CLINIC ONLY)
CD4 % Helper T Cell: 56 % — ABNORMAL HIGH (ref 33–55)
CD4 T Cell Abs: 820 /uL (ref 400–2700)

## 2016-02-14 DIAGNOSIS — H1011 Acute atopic conjunctivitis, right eye: Secondary | ICD-10-CM | POA: Diagnosis not present

## 2016-02-21 ENCOUNTER — Encounter: Payer: Self-pay | Admitting: Infectious Disease

## 2016-02-21 ENCOUNTER — Ambulatory Visit (INDEPENDENT_AMBULATORY_CARE_PROVIDER_SITE_OTHER): Payer: BLUE CROSS/BLUE SHIELD | Admitting: Infectious Disease

## 2016-02-21 VITALS — BP 135/91 | HR 96 | Temp 98.9°F | Ht 73.0 in | Wt 252.8 lb

## 2016-02-21 DIAGNOSIS — Z87891 Personal history of nicotine dependence: Secondary | ICD-10-CM

## 2016-02-21 DIAGNOSIS — B20 Human immunodeficiency virus [HIV] disease: Secondary | ICD-10-CM | POA: Diagnosis not present

## 2016-02-21 DIAGNOSIS — R030 Elevated blood-pressure reading, without diagnosis of hypertension: Secondary | ICD-10-CM

## 2016-02-21 DIAGNOSIS — J301 Allergic rhinitis due to pollen: Secondary | ICD-10-CM

## 2016-02-21 DIAGNOSIS — R635 Abnormal weight gain: Secondary | ICD-10-CM | POA: Diagnosis not present

## 2016-02-21 DIAGNOSIS — IMO0001 Reserved for inherently not codable concepts without codable children: Secondary | ICD-10-CM

## 2016-02-21 HISTORY — DX: Reserved for inherently not codable concepts without codable children: IMO0001
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
## 2016-04-07 ENCOUNTER — Other Ambulatory Visit: Payer: Self-pay | Admitting: Infectious Disease

## 2016-04-07 DIAGNOSIS — B20 Human immunodeficiency virus [HIV] disease: Secondary | ICD-10-CM

## 2016-04-12 DIAGNOSIS — I10 Essential (primary) hypertension: Secondary | ICD-10-CM | POA: Diagnosis not present

## 2016-04-15 DIAGNOSIS — Z23 Encounter for immunization: Secondary | ICD-10-CM | POA: Diagnosis not present

## 2016-05-23 DIAGNOSIS — J0100 Acute maxillary sinusitis, unspecified: Secondary | ICD-10-CM | POA: Diagnosis not present

## 2016-07-26 DIAGNOSIS — E786 Lipoprotein deficiency: Secondary | ICD-10-CM | POA: Diagnosis not present

## 2016-07-26 DIAGNOSIS — Z6833 Body mass index (BMI) 33.0-33.9, adult: Secondary | ICD-10-CM | POA: Diagnosis not present

## 2016-07-26 DIAGNOSIS — E781 Pure hyperglyceridemia: Secondary | ICD-10-CM | POA: Diagnosis not present

## 2016-07-26 DIAGNOSIS — I10 Essential (primary) hypertension: Secondary | ICD-10-CM | POA: Diagnosis not present

## 2016-08-09 DIAGNOSIS — F17290 Nicotine dependence, other tobacco product, uncomplicated: Secondary | ICD-10-CM | POA: Diagnosis not present

## 2016-08-09 DIAGNOSIS — I10 Essential (primary) hypertension: Secondary | ICD-10-CM | POA: Diagnosis not present

## 2016-08-23 DIAGNOSIS — F17290 Nicotine dependence, other tobacco product, uncomplicated: Secondary | ICD-10-CM | POA: Diagnosis not present

## 2017-01-24 DIAGNOSIS — E781 Pure hyperglyceridemia: Secondary | ICD-10-CM | POA: Diagnosis not present

## 2017-01-24 DIAGNOSIS — E786 Lipoprotein deficiency: Secondary | ICD-10-CM | POA: Diagnosis not present

## 2017-01-24 DIAGNOSIS — Z6834 Body mass index (BMI) 34.0-34.9, adult: Secondary | ICD-10-CM | POA: Diagnosis not present

## 2017-01-24 DIAGNOSIS — I10 Essential (primary) hypertension: Secondary | ICD-10-CM | POA: Diagnosis not present

## 2017-03-04 DIAGNOSIS — Z23 Encounter for immunization: Secondary | ICD-10-CM | POA: Diagnosis not present

## 2017-03-04 DIAGNOSIS — Z6835 Body mass index (BMI) 35.0-35.9, adult: Secondary | ICD-10-CM | POA: Diagnosis not present

## 2017-03-04 DIAGNOSIS — Z1389 Encounter for screening for other disorder: Secondary | ICD-10-CM | POA: Diagnosis not present

## 2017-03-04 DIAGNOSIS — E669 Obesity, unspecified: Secondary | ICD-10-CM | POA: Diagnosis not present

## 2017-03-04 DIAGNOSIS — Z0000 Encounter for general adult medical examination without abnormal findings: Secondary | ICD-10-CM | POA: Diagnosis not present

## 2017-03-21 ENCOUNTER — Other Ambulatory Visit: Payer: Self-pay | Admitting: Infectious Diseases

## 2017-03-21 DIAGNOSIS — B20 Human immunodeficiency virus [HIV] disease: Secondary | ICD-10-CM

## 2017-04-11 DIAGNOSIS — E785 Hyperlipidemia, unspecified: Secondary | ICD-10-CM | POA: Diagnosis not present

## 2017-04-11 DIAGNOSIS — G4733 Obstructive sleep apnea (adult) (pediatric): Secondary | ICD-10-CM | POA: Diagnosis not present

## 2017-04-11 DIAGNOSIS — R5383 Other fatigue: Secondary | ICD-10-CM | POA: Diagnosis not present

## 2017-04-11 DIAGNOSIS — B20 Human immunodeficiency virus [HIV] disease: Secondary | ICD-10-CM | POA: Diagnosis not present

## 2017-04-17 ENCOUNTER — Other Ambulatory Visit: Payer: BLUE CROSS/BLUE SHIELD

## 2017-04-17 ENCOUNTER — Other Ambulatory Visit (HOSPITAL_COMMUNITY)
Admission: RE | Admit: 2017-04-17 | Discharge: 2017-04-17 | Disposition: A | Discharge status: Home / Self Care | Payer: BLUE CROSS/BLUE SHIELD | Source: Ambulatory Visit | Attending: Infectious Disease | Admitting: Infectious Disease

## 2017-04-17 DIAGNOSIS — B20 Human immunodeficiency virus [HIV] disease: Secondary | ICD-10-CM | POA: Diagnosis not present

## 2017-04-17 DIAGNOSIS — Z113 Encounter for screening for infections with a predominantly sexual mode of transmission: Secondary | ICD-10-CM | POA: Insufficient documentation

## 2017-04-18 LAB — COMPLETE METABOLIC PANEL WITH GFR
AG RATIO: 1.6 (calc) (ref 1.0–2.5)
ALKALINE PHOSPHATASE (APISO): 62 U/L (ref 40–115)
ALT: 13 U/L (ref 9–46)
AST: 13 U/L (ref 10–40)
Albumin: 4.6 g/dL (ref 3.6–5.1)
BUN: 11 mg/dL (ref 7–25)
CO2: 27 mmol/L (ref 20–32)
Calcium: 9.6 mg/dL (ref 8.6–10.3)
Chloride: 100 mmol/L (ref 98–110)
Creat: 1.04 mg/dL (ref 0.60–1.35)
GFR, Est African American: 101 mL/min/{1.73_m2} (ref >=60)
GFR, Est Non African American: 87 mL/min/{1.73_m2} (ref >=60)
GLOBULIN: 2.8 g/dL (ref 1.9–3.7)
Glucose, Bld: 95 mg/dL (ref 65–99)
POTASSIUM: 3.8 mmol/L (ref 3.5–5.3)
SODIUM: 137 mmol/L (ref 135–146)
Total Bilirubin: 0.3 mg/dL (ref 0.2–1.2)
Total Protein: 7.4 g/dL (ref 6.1–8.1)

## 2017-04-18 LAB — CBC WITH DIFFERENTIAL/PLATELET
BASOS ABS: 21 {cells}/uL (ref 0–200)
Basophils Relative: 0.5 %
EOS PCT: 1.7 %
Eosinophils Absolute: 71 cells/uL (ref 15–500)
HEMATOCRIT: 48.3 % (ref 38.5–50.0)
Hemoglobin: 16.4 g/dL (ref 13.2–17.1)
Lymphs Abs: 966 cells/uL (ref 850–3900)
MCH: 30.3 pg (ref 27.0–33.0)
MCHC: 34 g/dL (ref 32.0–36.0)
MCV: 89.1 fL (ref 80.0–100.0)
MPV: 10.3 fL (ref 7.5–12.5)
Monocytes Relative: 11.1 %
NEUTROS PCT: 63.7 %
Neutro Abs: 2675 cells/uL (ref 1500–7800)
PLATELETS: 284 10*3/uL (ref 140–400)
RBC: 5.42 10*6/uL (ref 4.20–5.80)
RDW: 13.5 % (ref 11.0–15.0)
TOTAL LYMPHOCYTE: 23 %
WBC mixed population: 466 cells/uL (ref 200–950)
WBC: 4.2 10*3/uL (ref 3.8–10.8)

## 2017-04-18 LAB — RPR: RPR: NONREACTIVE

## 2017-04-18 LAB — T-HELPER CELL (CD4) - (RCID CLINIC ONLY)
CD4 % Helper T Cell: 55 % (ref 33–55)
CD4 T Cell Abs: 520 /uL (ref 400–2700)

## 2017-04-21 LAB — HIV-1 RNA QUANT-NO REFLEX-BLD
HIV 1 RNA Quant: <20 copies/mL — AB
HIV-1 RNA Quant, Log: <1.3 Log copies/mL — AB

## 2017-04-21 LAB — URINE CYTOLOGY ANCILLARY ONLY
Chlamydia: NEGATIVE
Neisseria Gonorrhea: NEGATIVE

## 2017-04-30 ENCOUNTER — Other Ambulatory Visit: Payer: Self-pay | Admitting: Pharmacist

## 2017-04-30 ENCOUNTER — Encounter: Payer: Self-pay | Admitting: Infectious Disease

## 2017-04-30 ENCOUNTER — Ambulatory Visit (INDEPENDENT_AMBULATORY_CARE_PROVIDER_SITE_OTHER): Payer: BLUE CROSS/BLUE SHIELD | Admitting: Infectious Disease

## 2017-04-30 VITALS — BP 136/97 | HR 88 | Temp 98.7°F | Ht 72.0 in | Wt 250.0 lb

## 2017-04-30 DIAGNOSIS — I10 Essential (primary) hypertension: Secondary | ICD-10-CM | POA: Diagnosis not present

## 2017-04-30 DIAGNOSIS — B20 Human immunodeficiency virus [HIV] disease: Secondary | ICD-10-CM | POA: Diagnosis not present

## 2017-04-30 DIAGNOSIS — Z23 Encounter for immunization: Secondary | ICD-10-CM

## 2017-04-30 HISTORY — DX: Essential (primary) hypertension: I10

## 2017-04-30 MED ORDER — BICTEGRAVIR-EMTRICITAB-TENOFOV 50-200-25 MG PO TABS: 1.0000 | ORAL_TABLET | Freq: Every day | ORAL | 11 refills | Status: DC

## 2017-04-30 MED FILL — BIKTARVY 50-200-25 MG TABS: 50-200-25 | 30 days supply | Qty: 30 | Fill #0

## 2017-04-30 NOTE — Progress Notes (Signed)
HPI: NIKKO GOLDWIRE is a 46 y.o. male who is here to see Dr. Tommy Medal for his HIV f/u.   Allergies: Allergies  Allergen Reactions  . Codeine     REACTION: makes pt very sleepy    Vitals: Temp: 98.7 F (37.1 C) (10/24 1105) Temp Source: Oral (10/24 1105) BP: 136/97 (10/24 1105) Pulse Rate: 88 (10/24 1105)  Past Medical History: Past Medical History:  Diagnosis Date  . Anxiety   . Depression   . HIV infection ([place])   . HTN, white coat 02/21/2016  . Hypertension     Social History: Social History   Social History  . Marital status: Married    Spouse name: N/A  . Number of children: N/A  . Years of education: N/A   Social History Main Topics  . Smoking status: Former Research scientist (life sciences)  . Smokeless tobacco: Never Used  . Alcohol use 0.0 oz/week     Comment: rarely  . Drug use: No  . Sexual activity: Yes     Comment: declined condoms   Other Topics Concern  . None   Social History Narrative  . None    Previous Regimen:   Current Regimen: ATP  Labs: HIV 1 RNA Quant (copies/mL)  Date Value  04/17/2017 <20 DETECTED (A)  02/07/2016 <20  11/15/2014 <20   CD4 T Cell Abs (/uL)  Date Value  04/18/2017 520  02/07/2016 820  11/15/2014 900   Hep B S Ab (no units)  Date Value  09/01/2006 No   Hepatitis B Surface Ag (no units)  Date Value  09/01/2006 No   HCV Ab (no units)  Date Value  09/01/2006 No    CrCl: Estimated Creatinine Clearance: 117.8 mL/min (by C-G formula based on SCr of 1.04 mg/dL).  Lipids:    Component Value Date/Time   CHOL 173 02/07/2016 1346   TRIG 289 (H) 02/07/2016 1346   HDL 34 (L) 02/07/2016 1346   CHOLHDL 5.1 (H) 02/07/2016 1346   VLDL 58 (H) 02/07/2016 1346   LDLCALC 81 02/07/2016 1346    Assessment: Aqil is doing very well on his ATP. He is commercially insured. However, he has been paying $125 every month or 3 months copay. We are going to change him to [place] so we can handle all of the copay for him. He lives in  [place] so it'll be mailed monthly. Everything went through smoothly with his fill.   Recommendations:  Stop ATP Start Biktarvy 1 PO qday F/u with Dr. Tommy Medal in Dec  Eutimio Gharibian, PharmD, BCPS, AAHIVP, [place] [facility] 04/30/2017, 12:07 PM
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
## 2017-05-05 DIAGNOSIS — J0100 Acute maxillary sinusitis, unspecified: Secondary | ICD-10-CM | POA: Diagnosis not present

## 2017-05-14 ENCOUNTER — Other Ambulatory Visit: Payer: BLUE CROSS/BLUE SHIELD

## 2017-05-14 DIAGNOSIS — B20 Human immunodeficiency virus [HIV] disease: Secondary | ICD-10-CM

## 2017-05-14 LAB — CBC WITH DIFFERENTIAL/PLATELET
BASOS PCT: 0.7 %
Basophils Absolute: 48 cells/uL (ref 0–200)
Eosinophils Absolute: 159 cells/uL (ref 15–500)
Eosinophils Relative: 2.3 %
HEMATOCRIT: 48.6 % (ref 38.5–50.0)
Hemoglobin: 16.5 g/dL (ref 13.2–17.1)
LYMPHS ABS: 1670 {cells}/uL (ref 850–3900)
MCH: 29.5 pg (ref 27.0–33.0)
MCHC: 34 g/dL (ref 32.0–36.0)
MCV: 86.8 fL (ref 80.0–100.0)
MPV: 10.1 fL (ref 7.5–12.5)
Monocytes Relative: 8.4 %
NEUTROS ABS: 4444 {cells}/uL (ref 1500–7800)
Neutrophils Relative %: 64.4 %
PLATELETS: 316 10*3/uL (ref 140–400)
RBC: 5.6 10*6/uL (ref 4.20–5.80)
RDW: 13.3 % (ref 11.0–15.0)
Total Lymphocyte: 24.2 %
WBC: 6.9 10*3/uL (ref 3.8–10.8)
WBCMIX: 580 {cells}/uL (ref 200–950)

## 2017-05-14 LAB — COMPLETE METABOLIC PANEL WITH GFR
AG Ratio: 1.6 (calc) (ref 1.0–2.5)
ALKALINE PHOSPHATASE (APISO): 68 U/L (ref 40–115)
ALT: 12 U/L (ref 9–46)
AST: 11 U/L (ref 10–40)
Albumin: 4.4 g/dL (ref 3.6–5.1)
BUN: 12 mg/dL (ref 7–25)
CO2: 26 mmol/L (ref 20–32)
CREATININE: 1.01 mg/dL (ref 0.60–1.35)
Calcium: 9.6 mg/dL (ref 8.6–10.3)
Chloride: 101 mmol/L (ref 98–110)
GFR, Est African American: 104 mL/min/{1.73_m2} (ref >=60)
GFR, Est Non African American: 90 mL/min/{1.73_m2} (ref >=60)
GLUCOSE: 77 mg/dL (ref 65–99)
Globulin: 2.7 g/dL (calc) (ref 1.9–3.7)
Potassium: 4.1 mmol/L (ref 3.5–5.3)
Sodium: 136 mmol/L (ref 135–146)
Total Bilirubin: 0.3 mg/dL (ref 0.2–1.2)
Total Protein: 7.1 g/dL (ref 6.1–8.1)

## 2017-05-15 LAB — T-HELPER CELL (CD4) - (RCID CLINIC ONLY)
CD4 % Helper T Cell: 41 % (ref 33–55)
CD4 T Cell Abs: 750 /uL (ref 400–2700)

## 2017-05-16 LAB — HIV-1 RNA QUANT-NO REFLEX-BLD
HIV 1 RNA QUANT: NOT DETECTED {copies}/mL
HIV-1 RNA Quant, Log: <1.3 Log copies/mL

## 2017-05-22 ENCOUNTER — Other Ambulatory Visit: Payer: BLUE CROSS/BLUE SHIELD

## 2017-05-23 ENCOUNTER — Other Ambulatory Visit: Payer: Self-pay | Admitting: Pharmacist Clinician (PhC)/ Clinical Pharmacy Specialist

## 2017-05-23 MED ORDER — BICTEGRAVIR-EMTRICITAB-TENOFOV 50-200-25 MG PO TABS: 1.0000 | ORAL_TABLET | Freq: Every day | ORAL | 11 refills | Status: DC

## 2017-05-23 MED FILL — BIKTARVY 50-200-25 MG TABS: 50-200-25 | 30 days supply | Qty: 30 | Fill #1

## 2017-05-23 NOTE — Progress Notes (Signed)
Biktarvy has to be tx to [facility]

## 2017-06-11 ENCOUNTER — Encounter: Payer: Self-pay | Admitting: Infectious Disease

## 2017-06-11 ENCOUNTER — Ambulatory Visit: Payer: BLUE CROSS/BLUE SHIELD | Admitting: Infectious Disease

## 2017-06-11 VITALS — BP 147/94 | HR 96 | Temp 98.1°F | Wt 253.0 lb

## 2017-06-11 DIAGNOSIS — B20 Human immunodeficiency virus [HIV] disease: Secondary | ICD-10-CM

## 2017-06-11 DIAGNOSIS — Z79899 Other long term (current) drug therapy: Secondary | ICD-10-CM | POA: Diagnosis not present

## 2017-06-11 DIAGNOSIS — I10 Essential (primary) hypertension: Secondary | ICD-10-CM | POA: Diagnosis not present

## 2017-06-11 MED ORDER — BICTEGRAVIR-EMTRICITAB-TENOFOV 50-200-25 MG PO TABS: 1.0000 | ORAL_TABLET | Freq: Every day | ORAL | 3 refills | Status: DC
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
## 2017-07-17 DIAGNOSIS — Z008 Encounter for other general examination: Secondary | ICD-10-CM | POA: Diagnosis not present

## 2017-07-17 DIAGNOSIS — R05 Cough: Secondary | ICD-10-CM | POA: Diagnosis not present

## 2017-07-17 DIAGNOSIS — Z1389 Encounter for screening for other disorder: Secondary | ICD-10-CM | POA: Diagnosis not present

## 2017-07-17 DIAGNOSIS — E669 Obesity, unspecified: Secondary | ICD-10-CM | POA: Diagnosis not present

## 2017-07-17 DIAGNOSIS — E782 Mixed hyperlipidemia: Secondary | ICD-10-CM | POA: Diagnosis not present

## 2017-07-17 DIAGNOSIS — Z719 Counseling, unspecified: Secondary | ICD-10-CM | POA: Diagnosis not present

## 2017-07-17 DIAGNOSIS — J0100 Acute maxillary sinusitis, unspecified: Secondary | ICD-10-CM | POA: Diagnosis not present

## 2017-07-17 DIAGNOSIS — Z716 Tobacco abuse counseling: Secondary | ICD-10-CM | POA: Diagnosis not present

## 2017-07-17 DIAGNOSIS — Z6833 Body mass index (BMI) 33.0-33.9, adult: Secondary | ICD-10-CM | POA: Diagnosis not present

## 2017-08-19 DIAGNOSIS — E782 Mixed hyperlipidemia: Secondary | ICD-10-CM | POA: Diagnosis not present

## 2017-08-19 DIAGNOSIS — Z79899 Other long term (current) drug therapy: Secondary | ICD-10-CM | POA: Diagnosis not present

## 2017-08-19 DIAGNOSIS — Z139 Encounter for screening, unspecified: Secondary | ICD-10-CM | POA: Diagnosis not present

## 2017-08-19 DIAGNOSIS — F17290 Nicotine dependence, other tobacco product, uncomplicated: Secondary | ICD-10-CM | POA: Diagnosis not present

## 2017-08-19 DIAGNOSIS — Z716 Tobacco abuse counseling: Secondary | ICD-10-CM | POA: Diagnosis not present

## 2017-08-19 DIAGNOSIS — E559 Vitamin D deficiency, unspecified: Secondary | ICD-10-CM | POA: Diagnosis not present

## 2017-09-02 ENCOUNTER — Encounter: Payer: Self-pay | Admitting: Infectious Disease

## 2017-09-18 DIAGNOSIS — Z716 Tobacco abuse counseling: Secondary | ICD-10-CM | POA: Diagnosis not present

## 2017-09-18 DIAGNOSIS — F17290 Nicotine dependence, other tobacco product, uncomplicated: Secondary | ICD-10-CM | POA: Diagnosis not present

## 2018-01-29 DIAGNOSIS — Z008 Encounter for other general examination: Secondary | ICD-10-CM | POA: Diagnosis not present

## 2018-01-29 DIAGNOSIS — Z719 Counseling, unspecified: Secondary | ICD-10-CM | POA: Diagnosis not present

## 2018-01-29 DIAGNOSIS — E782 Mixed hyperlipidemia: Secondary | ICD-10-CM | POA: Diagnosis not present

## 2018-01-29 DIAGNOSIS — E669 Obesity, unspecified: Secondary | ICD-10-CM | POA: Diagnosis not present

## 2018-03-10 DIAGNOSIS — Z0130 Encounter for examination of blood pressure without abnormal findings: Secondary | ICD-10-CM | POA: Diagnosis not present

## 2018-03-10 DIAGNOSIS — Z139 Encounter for screening, unspecified: Secondary | ICD-10-CM | POA: Diagnosis not present

## 2018-03-10 DIAGNOSIS — E559 Vitamin D deficiency, unspecified: Secondary | ICD-10-CM | POA: Diagnosis not present

## 2018-03-10 DIAGNOSIS — E782 Mixed hyperlipidemia: Secondary | ICD-10-CM | POA: Diagnosis not present

## 2018-03-10 DIAGNOSIS — Z79899 Other long term (current) drug therapy: Secondary | ICD-10-CM | POA: Diagnosis not present

## 2018-03-19 DIAGNOSIS — R7989 Other specified abnormal findings of blood chemistry: Secondary | ICD-10-CM | POA: Diagnosis not present

## 2018-03-19 DIAGNOSIS — E559 Vitamin D deficiency, unspecified: Secondary | ICD-10-CM | POA: Diagnosis not present

## 2018-03-19 DIAGNOSIS — E782 Mixed hyperlipidemia: Secondary | ICD-10-CM | POA: Diagnosis not present

## 2018-03-31 DIAGNOSIS — Z23 Encounter for immunization: Secondary | ICD-10-CM | POA: Diagnosis not present

## 2018-04-02 DIAGNOSIS — R799 Abnormal finding of blood chemistry, unspecified: Secondary | ICD-10-CM | POA: Diagnosis not present

## 2018-04-23 DIAGNOSIS — Z008 Encounter for other general examination: Secondary | ICD-10-CM | POA: Diagnosis not present

## 2018-04-23 DIAGNOSIS — R7989 Other specified abnormal findings of blood chemistry: Secondary | ICD-10-CM | POA: Diagnosis not present

## 2018-05-19 DIAGNOSIS — Z6833 Body mass index (BMI) 33.0-33.9, adult: Secondary | ICD-10-CM | POA: Diagnosis not present

## 2018-05-19 DIAGNOSIS — I10 Essential (primary) hypertension: Secondary | ICD-10-CM | POA: Diagnosis not present

## 2018-05-19 DIAGNOSIS — Z0001 Encounter for general adult medical examination with abnormal findings: Secondary | ICD-10-CM | POA: Diagnosis not present

## 2018-05-19 DIAGNOSIS — N183 Chronic kidney disease, stage 3 (moderate): Secondary | ICD-10-CM | POA: Diagnosis not present

## 2018-05-19 DIAGNOSIS — Z0000 Encounter for general adult medical examination without abnormal findings: Secondary | ICD-10-CM | POA: Diagnosis not present

## 2018-05-19 DIAGNOSIS — E6609 Other obesity due to excess calories: Secondary | ICD-10-CM | POA: Diagnosis not present

## 2018-05-19 DIAGNOSIS — Z1389 Encounter for screening for other disorder: Secondary | ICD-10-CM | POA: Diagnosis not present

## 2018-06-03 ENCOUNTER — Other Ambulatory Visit (HOSPITAL_COMMUNITY)
Admission: RE | Admit: 2018-06-03 | Discharge: 2018-06-03 | Disposition: A | Discharge status: Home / Self Care | Payer: BLUE CROSS/BLUE SHIELD | Source: Ambulatory Visit | Attending: Infectious Disease | Admitting: Infectious Disease

## 2018-06-03 ENCOUNTER — Other Ambulatory Visit: Payer: BLUE CROSS/BLUE SHIELD

## 2018-06-03 DIAGNOSIS — B20 Human immunodeficiency virus [HIV] disease: Secondary | ICD-10-CM | POA: Diagnosis not present

## 2018-06-03 DIAGNOSIS — I10 Essential (primary) hypertension: Secondary | ICD-10-CM | POA: Insufficient documentation

## 2018-06-03 DIAGNOSIS — Z79899 Other long term (current) drug therapy: Secondary | ICD-10-CM | POA: Insufficient documentation

## 2018-06-05 LAB — T-HELPER CELL (CD4) - (RCID CLINIC ONLY)
CD4 % Helper T Cell: 47 % (ref 33–55)
CD4 T Cell Abs: 570 /uL (ref 400–2700)

## 2018-06-05 LAB — URINE CYTOLOGY ANCILLARY ONLY
CHLAMYDIA, DNA PROBE: NEGATIVE
NEISSERIA GONORRHEA: NEGATIVE

## 2018-06-08 ENCOUNTER — Other Ambulatory Visit: Payer: Self-pay | Admitting: Infectious Disease

## 2018-06-09 LAB — COMPLETE METABOLIC PANEL WITH GFR
AG RATIO: 2 (calc) (ref 1.0–2.5)
ALBUMIN MSPROF: 4.5 g/dL (ref 3.6–5.1)
ALKALINE PHOSPHATASE (APISO): 41 U/L (ref 40–115)
ALT: 15 U/L (ref 9–46)
AST: 16 U/L (ref 10–40)
BUN: 20 mg/dL (ref 7–25)
CO2: 26 mmol/L (ref 20–32)
CREATININE: 1.31 mg/dL (ref 0.60–1.35)
Calcium: 9.6 mg/dL (ref 8.6–10.3)
Chloride: 104 mmol/L (ref 98–110)
GFR, EST NON AFRICAN AMERICAN: 65 mL/min/{1.73_m2} (ref >=60)
GFR, Est African American: 76 mL/min/{1.73_m2} (ref >=60)
GLOBULIN: 2.3 g/dL (ref 1.9–3.7)
Glucose, Bld: 87 mg/dL (ref 65–99)
POTASSIUM: 4.6 mmol/L (ref 3.5–5.3)
SODIUM: 139 mmol/L (ref 135–146)
Total Bilirubin: 0.5 mg/dL (ref 0.2–1.2)
Total Protein: 6.8 g/dL (ref 6.1–8.1)

## 2018-06-09 LAB — CBC WITH DIFFERENTIAL/PLATELET
BASOS PCT: 0.6 %
Basophils Absolute: 29 cells/uL (ref 0–200)
EOS PCT: 1.6 %
Eosinophils Absolute: 78 cells/uL (ref 15–500)
HEMATOCRIT: 45.4 % (ref 38.5–50.0)
Hemoglobin: 15.4 g/dL (ref 13.2–17.1)
LYMPHS ABS: 1308 {cells}/uL (ref 850–3900)
MCH: 30.1 pg (ref 27.0–33.0)
MCHC: 33.9 g/dL (ref 32.0–36.0)
MCV: 88.8 fL (ref 80.0–100.0)
MPV: 10.5 fL (ref 7.5–12.5)
Monocytes Relative: 7.4 %
NEUTROS ABS: 3121 {cells}/uL (ref 1500–7800)
NEUTROS PCT: 63.7 %
Platelets: 258 10*3/uL (ref 140–400)
RBC: 5.11 10*6/uL (ref 4.20–5.80)
RDW: 14 % (ref 11.0–15.0)
Total Lymphocyte: 26.7 %
WBC: 4.9 10*3/uL (ref 3.8–10.8)
WBCMIX: 363 {cells}/uL (ref 200–950)

## 2018-06-09 LAB — LIPID PANEL
CHOL/HDL RATIO: 4.4 (calc) (ref <5.0)
Cholesterol: 173 mg/dL (ref <200)
HDL: 39 mg/dL — AB (ref >40)
LDL CHOLESTEROL (CALC): 112 mg/dL — AB
NON-HDL CHOLESTEROL (CALC): 134 mg/dL — AB (ref <130)
Triglycerides: 112 mg/dL (ref <150)

## 2018-06-09 LAB — HIV-1 RNA QUANT-NO REFLEX-BLD
HIV 1 RNA QUANT: NOT DETECTED {copies}/mL
HIV-1 RNA Quant, Log: <1.3 Log copies/mL

## 2018-06-09 LAB — RPR: RPR: NONREACTIVE

## 2018-06-17 ENCOUNTER — Ambulatory Visit: Payer: BLUE CROSS/BLUE SHIELD | Admitting: Infectious Disease

## 2018-06-19 ENCOUNTER — Ambulatory Visit (INDEPENDENT_AMBULATORY_CARE_PROVIDER_SITE_OTHER): Payer: BLUE CROSS/BLUE SHIELD | Admitting: Infectious Disease

## 2018-06-19 ENCOUNTER — Encounter: Payer: Self-pay | Admitting: Infectious Disease

## 2018-06-19 VITALS — BP 126/80 | HR 79 | Temp 98.3°F | Wt 253.0 lb

## 2018-06-19 DIAGNOSIS — B20 Human immunodeficiency virus [HIV] disease: Secondary | ICD-10-CM | POA: Diagnosis not present

## 2018-06-19 DIAGNOSIS — Z23 Encounter for immunization: Secondary | ICD-10-CM

## 2018-06-19 DIAGNOSIS — I10 Essential (primary) hypertension: Secondary | ICD-10-CM

## 2018-06-19 DIAGNOSIS — E663 Overweight: Secondary | ICD-10-CM

## 2018-06-19 DIAGNOSIS — R635 Abnormal weight gain: Secondary | ICD-10-CM

## 2018-06-19 DIAGNOSIS — Z6834 Body mass index (BMI) 34.0-34.9, adult: Secondary | ICD-10-CM | POA: Diagnosis not present

## 2018-06-19 DIAGNOSIS — J301 Allergic rhinitis due to pollen: Secondary | ICD-10-CM

## 2018-06-19 DIAGNOSIS — Z87891 Personal history of nicotine dependence: Secondary | ICD-10-CM

## 2018-06-19 DIAGNOSIS — Z79899 Other long term (current) drug therapy: Secondary | ICD-10-CM

## 2018-06-19 MED ORDER — BICTEGRAVIR-EMTRICITAB-TENOFOV 50-200-25 MG PO TABS: 1.0000 | ORAL_TABLET | Freq: Every day | ORAL | 11 refills | Status: DC
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
## 2018-06-25 DIAGNOSIS — Z716 Tobacco abuse counseling: Secondary | ICD-10-CM | POA: Diagnosis not present

## 2018-06-25 DIAGNOSIS — F17290 Nicotine dependence, other tobacco product, uncomplicated: Secondary | ICD-10-CM | POA: Diagnosis not present

## 2018-08-10 DIAGNOSIS — J111 Influenza due to unidentified influenza virus with other respiratory manifestations: Secondary | ICD-10-CM | POA: Diagnosis not present

## 2018-08-10 DIAGNOSIS — J209 Acute bronchitis, unspecified: Secondary | ICD-10-CM | POA: Diagnosis not present

## 2018-09-02 DIAGNOSIS — M5442 Lumbago with sciatica, left side: Secondary | ICD-10-CM | POA: Diagnosis not present

## 2018-09-02 DIAGNOSIS — S335XXA Sprain of ligaments of lumbar spine, initial encounter: Secondary | ICD-10-CM | POA: Diagnosis not present

## 2018-09-02 DIAGNOSIS — M546 Pain in thoracic spine: Secondary | ICD-10-CM | POA: Diagnosis not present

## 2018-09-03 DIAGNOSIS — Z008 Encounter for other general examination: Secondary | ICD-10-CM | POA: Diagnosis not present

## 2018-09-03 DIAGNOSIS — M5442 Lumbago with sciatica, left side: Secondary | ICD-10-CM | POA: Diagnosis not present

## 2018-09-03 DIAGNOSIS — E782 Mixed hyperlipidemia: Secondary | ICD-10-CM | POA: Diagnosis not present

## 2018-09-03 DIAGNOSIS — F17290 Nicotine dependence, other tobacco product, uncomplicated: Secondary | ICD-10-CM | POA: Diagnosis not present

## 2018-09-03 DIAGNOSIS — Z716 Tobacco abuse counseling: Secondary | ICD-10-CM | POA: Diagnosis not present

## 2018-09-04 DIAGNOSIS — M5442 Lumbago with sciatica, left side: Secondary | ICD-10-CM | POA: Diagnosis not present

## 2018-09-04 DIAGNOSIS — S335XXA Sprain of ligaments of lumbar spine, initial encounter: Secondary | ICD-10-CM | POA: Diagnosis not present

## 2018-09-04 DIAGNOSIS — M546 Pain in thoracic spine: Secondary | ICD-10-CM | POA: Diagnosis not present

## 2018-11-25 DIAGNOSIS — E6609 Other obesity due to excess calories: Secondary | ICD-10-CM | POA: Diagnosis not present

## 2018-11-25 DIAGNOSIS — M5432 Sciatica, left side: Secondary | ICD-10-CM | POA: Diagnosis not present

## 2018-11-25 DIAGNOSIS — M545 Low back pain: Secondary | ICD-10-CM | POA: Diagnosis not present

## 2018-11-25 DIAGNOSIS — Z6835 Body mass index (BMI) 35.0-35.9, adult: Secondary | ICD-10-CM | POA: Diagnosis not present

## 2018-11-25 DIAGNOSIS — Z1389 Encounter for screening for other disorder: Secondary | ICD-10-CM | POA: Diagnosis not present

## 2019-02-25 DIAGNOSIS — E669 Obesity, unspecified: Secondary | ICD-10-CM | POA: Diagnosis not present

## 2019-02-25 DIAGNOSIS — Z7689 Persons encountering health services in other specified circumstances: Secondary | ICD-10-CM | POA: Diagnosis not present

## 2019-02-25 DIAGNOSIS — Z6833 Body mass index (BMI) 33.0-33.9, adult: Secondary | ICD-10-CM | POA: Diagnosis not present

## 2019-04-19 DIAGNOSIS — Z23 Encounter for immunization: Secondary | ICD-10-CM | POA: Diagnosis not present

## 2019-04-20 DIAGNOSIS — Z139 Encounter for screening, unspecified: Secondary | ICD-10-CM | POA: Diagnosis not present

## 2019-04-20 DIAGNOSIS — E559 Vitamin D deficiency, unspecified: Secondary | ICD-10-CM | POA: Diagnosis not present

## 2019-04-20 DIAGNOSIS — Z79899 Other long term (current) drug therapy: Secondary | ICD-10-CM | POA: Diagnosis not present

## 2019-04-20 DIAGNOSIS — R7309 Other abnormal glucose: Secondary | ICD-10-CM | POA: Diagnosis not present

## 2019-04-20 DIAGNOSIS — E782 Mixed hyperlipidemia: Secondary | ICD-10-CM | POA: Diagnosis not present

## 2019-04-20 DIAGNOSIS — Z0130 Encounter for examination of blood pressure without abnormal findings: Secondary | ICD-10-CM | POA: Diagnosis not present

## 2019-04-22 ENCOUNTER — Other Ambulatory Visit: Payer: Self-pay | Admitting: Infectious Disease

## 2019-04-22 DIAGNOSIS — Z7689 Persons encountering health services in other specified circumstances: Secondary | ICD-10-CM | POA: Diagnosis not present

## 2019-04-22 DIAGNOSIS — I10 Essential (primary) hypertension: Secondary | ICD-10-CM | POA: Diagnosis not present

## 2019-04-22 DIAGNOSIS — E669 Obesity, unspecified: Secondary | ICD-10-CM | POA: Diagnosis not present

## 2019-04-22 DIAGNOSIS — E782 Mixed hyperlipidemia: Secondary | ICD-10-CM | POA: Diagnosis not present

## 2019-04-22 DIAGNOSIS — B20 Human immunodeficiency virus [HIV] disease: Secondary | ICD-10-CM

## 2019-06-22 ENCOUNTER — Other Ambulatory Visit: Payer: Self-pay

## 2019-06-22 ENCOUNTER — Other Ambulatory Visit: Payer: BC Managed Care – PPO

## 2019-06-22 DIAGNOSIS — B20 Human immunodeficiency virus [HIV] disease: Secondary | ICD-10-CM

## 2019-06-23 LAB — T-HELPER CELL (CD4) - (RCID CLINIC ONLY)
CD4 % Helper T Cell: 57 % (ref 33–65)
CD4 T Cell Abs: 697 /uL (ref 400–1790)

## 2019-07-06 LAB — COMPLETE METABOLIC PANEL WITH GFR
AG Ratio: 1.8 (calc) (ref 1.0–2.5)
ALT: 16 U/L (ref 9–46)
AST: 14 U/L (ref 10–40)
Albumin: 4.5 g/dL (ref 3.6–5.1)
Alkaline phosphatase (APISO): 44 U/L (ref 36–130)
BUN: 17 mg/dL (ref 7–25)
CO2: 28 mmol/L (ref 20–32)
Calcium: 10 mg/dL (ref 8.6–10.3)
Chloride: 102 mmol/L (ref 98–110)
Creat: 1.16 mg/dL (ref 0.60–1.35)
GFR, Est African American: 87 mL/min/{1.73_m2} (ref >=60)
GFR, Est Non African American: 75 mL/min/{1.73_m2} (ref >=60)
Globulin: 2.5 g/dL (calc) (ref 1.9–3.7)
Glucose, Bld: 88 mg/dL (ref 65–99)
Potassium: 4.7 mmol/L (ref 3.5–5.3)
Sodium: 137 mmol/L (ref 135–146)
Total Bilirubin: 0.4 mg/dL (ref 0.2–1.2)
Total Protein: 7 g/dL (ref 6.1–8.1)

## 2019-07-06 LAB — CBC WITH DIFFERENTIAL/PLATELET
Absolute Monocytes: 313 cells/uL (ref 200–950)
Basophils Absolute: 32 cells/uL (ref 0–200)
Basophils Relative: 0.7 %
Eosinophils Absolute: 60 cells/uL (ref 15–500)
Eosinophils Relative: 1.3 %
HCT: 46.4 % (ref 38.5–50.0)
Hemoglobin: 15.8 g/dL (ref 13.2–17.1)
Lymphs Abs: 1247 cells/uL (ref 850–3900)
MCH: 30.2 pg (ref 27.0–33.0)
MCHC: 34.1 g/dL (ref 32.0–36.0)
MCV: 88.5 fL (ref 80.0–100.0)
MPV: 10.1 fL (ref 7.5–12.5)
Monocytes Relative: 6.8 %
Neutro Abs: 2949 cells/uL (ref 1500–7800)
Neutrophils Relative %: 64.1 %
Platelets: 266 10*3/uL (ref 140–400)
RBC: 5.24 10*6/uL (ref 4.20–5.80)
RDW: 13.6 % (ref 11.0–15.0)
Total Lymphocyte: 27.1 %
WBC: 4.6 10*3/uL (ref 3.8–10.8)

## 2019-07-06 LAB — LIPID PANEL
Cholesterol: 178 mg/dL (ref <200)
HDL: 36 mg/dL — ABNORMAL LOW (ref >=40)
LDL Cholesterol (Calc): 119 mg/dL (calc) — ABNORMAL HIGH
Non-HDL Cholesterol (Calc): 142 mg/dL (calc) — ABNORMAL HIGH (ref <130)
Total CHOL/HDL Ratio: 4.9 (calc) (ref <5.0)
Triglycerides: 119 mg/dL (ref <150)

## 2019-07-06 LAB — HIV-1 RNA QUANT-NO REFLEX-BLD
HIV 1 RNA Quant: <20 copies/mL
HIV-1 RNA Quant, Log: <1.3 Log copies/mL

## 2019-07-06 LAB — RPR: RPR Ser Ql: NONREACTIVE

## 2019-07-12 DIAGNOSIS — D2272 Melanocytic nevi of left lower limb, including hip: Secondary | ICD-10-CM | POA: Diagnosis not present

## 2019-07-12 DIAGNOSIS — D2261 Melanocytic nevi of right upper limb, including shoulder: Secondary | ICD-10-CM | POA: Diagnosis not present

## 2019-07-12 DIAGNOSIS — D235 Other benign neoplasm of skin of trunk: Secondary | ICD-10-CM | POA: Diagnosis not present

## 2019-07-12 DIAGNOSIS — D225 Melanocytic nevi of trunk: Secondary | ICD-10-CM | POA: Diagnosis not present

## 2019-07-13 ENCOUNTER — Ambulatory Visit: Payer: BC Managed Care – PPO | Admitting: Infectious Disease

## 2019-07-13 ENCOUNTER — Encounter: Payer: Self-pay | Admitting: Infectious Disease

## 2019-07-13 ENCOUNTER — Other Ambulatory Visit: Payer: Self-pay

## 2019-07-13 VITALS — BP 131/79 | HR 74 | Temp 97.9°F | Ht 74.0 in | Wt 248.0 lb

## 2019-07-13 DIAGNOSIS — J301 Allergic rhinitis due to pollen: Secondary | ICD-10-CM | POA: Diagnosis not present

## 2019-07-13 DIAGNOSIS — B20 Human immunodeficiency virus [HIV] disease: Secondary | ICD-10-CM | POA: Diagnosis not present

## 2019-07-13 DIAGNOSIS — I10 Essential (primary) hypertension: Secondary | ICD-10-CM | POA: Diagnosis not present

## 2019-07-13 MED ORDER — BIKTARVY 50-200-25 MG PO TABS: 1.0000 | ORAL_TABLET | Freq: Every day | ORAL | 11 refills | Status: DC

## 2019-07-13 NOTE — Progress Notes (Signed)
Subjective:    Chief complaitn followup for HIV  Patient ID: Thomas Armstrong, male    DOB: 04-27-1971, 49 y.o.   MRN: [id number]  HPI  49 year-old man doing extremely well on his current antiretroviral regimen of Atripla with an undetectable viral load and healthy CD4 count.  We switched him over to [place] after we had had [employer] performed that was itself undoubtedly erroneous see prior notes.  He is continue to maintain perfect virological suppression on this medication.  He has improved his weight having lost weight.  He is on an ACE inhibitor per primary care.  He was asking me about effects of BIKTARVY on creatinine and I went through explanation of how this particular integrase strand transfer inhibitor inhibits the tubular secretion of creatinine but does not affect the glomerular filtration rate.  The version of tenofovir that is in this drug tenovir alafenamide has not been clinical stylets shown any significant adverse renal effects as it is a much more efficient pro drug and gets 90% of its medication into the white blood cell with only 10% in circulation.  He did have an isolated creatinine of 1.3 last year beyond that I do not see much in the way of significant abnormalities in his serum creatinine.  He is continue to work 3 days from home but also going into work.  He is no longer traveling as much in light of the novel coronavirus 2019.      Past Medical History:  Diagnosis Date  . Anxiety   . Depression   . HIV infection ([place])   . HTN, white coat 02/21/2016  . Hypertension   . Hypertension 04/30/2017    No past surgical history on file.  Family History  Problem Relation Age of Onset  . Stroke Mother   . Stroke Father   . Heart disease Paternal Grandmother   . Stroke Paternal Grandmother       Social History   Socioeconomic History  . Marital status: Married    Spouse name: Not on file  . Number of children: Not on file  . Years of education:  Not on file  . Highest education level: Not on file  Occupational History  . Not on file  Tobacco Use  . Smoking status: Former Research scientist (life sciences)  . Smokeless tobacco: Never Used  Substance and Sexual Activity  . Alcohol use: Yes    Alcohol/week: 0.0 standard drinks    Comment: rarely  . Drug use: No  . Sexual activity: Yes    Comment: declined condoms  Other Topics Concern  . Not on file  Social History Narrative  . Not on file   Social Determinants of Health   Financial Resource Strain:   . Difficulty of Paying Living Expenses: Not on file  Food Insecurity:   . Worried About Charity fundraiser in the Last Year: Not on file  . Ran Out of Food in the Last Year: Not on file  Transportation Needs:   . Lack of Transportation (Medical): Not on file  . Lack of Transportation (Non-Medical): Not on file  Physical Activity:   . Days of Exercise per Week: Not on file  . Minutes of Exercise per Session: Not on file  Stress:   . Feeling of Stress : Not on file  Social Connections:   . Frequency of Communication with Friends and Family: Not on file  . Frequency of Social Gatherings with Friends and Family: Not on file  .  Attends Religious Services: Not on file  . Active Member of Clubs or Organizations: Not on file  . Attends Archivist Meetings: Not on file  . Marital Status: Not on file    Allergies  Allergen Reactions  . Codeine     REACTION: makes pt very sleepy     Current Outpatient Medications:  .  BIKTARVY 50-200-25 MG TABS tablet, TAKE 1 TABLET BY MOUTH  DAILY, Disp: 60 tablet, Rfl: 2 .  cetirizine (ZYRTEC) 10 MG tablet, Take 10 mg by mouth daily., Disp: , Rfl:  .  fluorouracil (EFUDEX) 5 % cream, , Disp: , Rfl:  .  lisinopril (ZESTRIL) 20 MG tablet, Take 20 mg by mouth daily., Disp: , Rfl:       Review of Systems  Constitutional: Negative for activity change, appetite change, chills, diaphoresis, fatigue, fever and unexpected weight change.  HENT: Negative  for congestion, rhinorrhea, sinus pressure, sneezing, sore throat and trouble swallowing.   Eyes: Negative for photophobia and visual disturbance.  Respiratory: Negative for cough, chest tightness, shortness of breath, wheezing and stridor.   Cardiovascular: Negative for chest pain, palpitations and leg swelling.  Gastrointestinal: Negative for abdominal distention, abdominal pain, anal bleeding, blood in stool, constipation, diarrhea, nausea and vomiting.  Genitourinary: Negative for difficulty urinating, dysuria, flank pain and hematuria.  Musculoskeletal: Negative for arthralgias, back pain, gait problem, joint swelling and myalgias.  Skin: Negative for color change, pallor, rash and wound.  Neurological: Negative for dizziness, tremors, weakness and light-headedness.  Hematological: Negative for adenopathy. Does not bruise/bleed easily.  Psychiatric/Behavioral: Negative for agitation, behavioral problems, confusion, decreased concentration, dysphoric mood and sleep disturbance.          Objective:   Physical Exam  Constitutional: He is oriented to person, place, and time. He appears well-developed and well-nourished. No distress.  HENT:  Head: Normocephalic and atraumatic.  Mouth/Throat: Oropharynx is clear and moist. No oropharyngeal exudate.  Eyes: Pupils are equal, round, and reactive to light. Conjunctivae and EOM are normal. No scleral icterus.  Neck: No JVD present. No thyromegaly present.  Cardiovascular: Normal rate, regular rhythm and normal heart sounds.  Pulmonary/Chest: Effort normal. No respiratory distress. He has no wheezes.  Abdominal: He exhibits no distension.  Musculoskeletal:        General: No tenderness.     Cervical back: Normal range of motion and neck supple.  Lymphadenopathy:    He has no cervical adenopathy.  Neurological: He is alert and oriented to person, place, and time. He has normal reflexes. He exhibits normal muscle tone. Coordination normal.   Skin: Skin is warm and dry. He is not diaphoretic. No erythema. No pallor.  Psychiatric: He has a normal mood and affect. His behavior is normal. Judgment and thought content normal.  Nursing note and vitals reviewed.         Assessment & Plan:  HIV: Maintains perfect neurological suppression he can come back in 1 years time I have sent in prescription for BIKTARVY 11 refills Weight gain: Has made great efforts to lose weight  HTN: Blood pressure better controlled he is also on an ACE inhibitor now Vitals:   07/13/19 0943  BP: 131/79  Pulse: 74  Temp: 97.9 F (36.6 C)  SpO2: 100%   Seasonal allergies on cetirizine

## 2020-01-14 DIAGNOSIS — Z125 Encounter for screening for malignant neoplasm of prostate: Secondary | ICD-10-CM | POA: Diagnosis not present

## 2020-01-14 DIAGNOSIS — Z21 Asymptomatic human immunodeficiency virus [HIV] infection status: Secondary | ICD-10-CM | POA: Diagnosis not present

## 2020-01-14 DIAGNOSIS — R000 Tachycardia, unspecified: Secondary | ICD-10-CM | POA: Diagnosis not present

## 2020-01-14 DIAGNOSIS — Z0001 Encounter for general adult medical examination with abnormal findings: Secondary | ICD-10-CM | POA: Diagnosis not present

## 2020-01-14 DIAGNOSIS — Z1389 Encounter for screening for other disorder: Secondary | ICD-10-CM | POA: Diagnosis not present

## 2020-01-14 DIAGNOSIS — Z6836 Body mass index (BMI) 36.0-36.9, adult: Secondary | ICD-10-CM | POA: Diagnosis not present

## 2020-01-14 DIAGNOSIS — E6609 Other obesity due to excess calories: Secondary | ICD-10-CM | POA: Diagnosis not present

## 2020-04-17 DIAGNOSIS — Z23 Encounter for immunization: Secondary | ICD-10-CM | POA: Diagnosis not present

## 2020-05-27 ENCOUNTER — Other Ambulatory Visit: Payer: Self-pay | Admitting: Infectious Disease

## 2020-05-27 DIAGNOSIS — B20 Human immunodeficiency virus [HIV] disease: Secondary | ICD-10-CM

## 2020-06-13 ENCOUNTER — Other Ambulatory Visit: Payer: BC Managed Care – PPO

## 2020-06-13 ENCOUNTER — Other Ambulatory Visit (HOSPITAL_COMMUNITY)
Admission: RE | Admit: 2020-06-13 | Discharge: 2020-06-13 | Disposition: A | Discharge status: Home / Self Care | Payer: BC Managed Care – PPO | Source: Ambulatory Visit | Attending: Infectious Disease | Admitting: Infectious Disease

## 2020-06-13 ENCOUNTER — Other Ambulatory Visit: Payer: Self-pay

## 2020-06-13 DIAGNOSIS — J301 Allergic rhinitis due to pollen: Secondary | ICD-10-CM | POA: Insufficient documentation

## 2020-06-13 DIAGNOSIS — B20 Human immunodeficiency virus [HIV] disease: Secondary | ICD-10-CM | POA: Insufficient documentation

## 2020-06-13 LAB — CYTOLOGY, (ORAL, ANAL, URETHRAL) ANCILLARY ONLY
Chlamydia: NEGATIVE
Comment: NEGATIVE
Comment: NORMAL
Neisseria Gonorrhea: NEGATIVE

## 2020-06-14 LAB — T-HELPER CELL (CD4) - (RCID CLINIC ONLY)
CD4 % Helper T Cell: 58 % (ref 33–65)
CD4 T Cell Abs: 766 /uL (ref 400–1790)

## 2020-06-15 LAB — HIV-1 RNA QUANT-NO REFLEX-BLD
HIV 1 RNA Quant: <20 Copies/mL
HIV-1 RNA Quant, Log: <1.3 Log cps/mL

## 2020-06-15 LAB — CBC WITH DIFFERENTIAL/PLATELET
Absolute Monocytes: 422 cells/uL (ref 200–950)
Basophils Absolute: 38 cells/uL (ref 0–200)
Basophils Relative: 0.6 %
Eosinophils Absolute: 83 cells/uL (ref 15–500)
Eosinophils Relative: 1.3 %
HCT: 46.9 % (ref 38.5–50.0)
Hemoglobin: 15.9 g/dL (ref 13.2–17.1)
Lymphs Abs: 1568 cells/uL (ref 850–3900)
MCH: 29.6 pg (ref 27.0–33.0)
MCHC: 33.9 g/dL (ref 32.0–36.0)
MCV: 87.3 fL (ref 80.0–100.0)
MPV: 9.9 fL (ref 7.5–12.5)
Monocytes Relative: 6.6 %
Neutro Abs: 4288 cells/uL (ref 1500–7800)
Neutrophils Relative %: 67 %
Platelets: 288 10*3/uL (ref 140–400)
RBC: 5.37 10*6/uL (ref 4.20–5.80)
RDW: 14.2 % (ref 11.0–15.0)
Total Lymphocyte: 24.5 %
WBC: 6.4 10*3/uL (ref 3.8–10.8)

## 2020-06-15 LAB — COMPLETE METABOLIC PANEL WITH GFR
AG Ratio: 1.7 (calc) (ref 1.0–2.5)
ALT: 18 U/L (ref 9–46)
AST: 17 U/L (ref 10–40)
Albumin: 4.5 g/dL (ref 3.6–5.1)
Alkaline phosphatase (APISO): 59 U/L (ref 36–130)
BUN: 15 mg/dL (ref 7–25)
CO2: 29 mmol/L (ref 20–32)
Calcium: 9.4 mg/dL (ref 8.6–10.3)
Chloride: 102 mmol/L (ref 98–110)
Creat: 1.24 mg/dL (ref 0.60–1.35)
GFR, Est African American: 80 mL/min/{1.73_m2} (ref >=60)
GFR, Est Non African American: 69 mL/min/{1.73_m2} (ref >=60)
Globulin: 2.6 g/dL (calc) (ref 1.9–3.7)
Glucose, Bld: 81 mg/dL (ref 65–99)
Potassium: 4.2 mmol/L (ref 3.5–5.3)
Sodium: 138 mmol/L (ref 135–146)
Total Bilirubin: 0.6 mg/dL (ref 0.2–1.2)
Total Protein: 7.1 g/dL (ref 6.1–8.1)

## 2020-06-15 LAB — LIPID PANEL
Cholesterol: 200 mg/dL — ABNORMAL HIGH (ref <200)
HDL: 37 mg/dL — ABNORMAL LOW (ref >=40)
LDL Cholesterol (Calc): 127 mg/dL (calc) — ABNORMAL HIGH
Non-HDL Cholesterol (Calc): 163 mg/dL (calc) — ABNORMAL HIGH (ref <130)
Total CHOL/HDL Ratio: 5.4 (calc) — ABNORMAL HIGH (ref <5.0)
Triglycerides: 217 mg/dL — ABNORMAL HIGH (ref <150)

## 2020-06-15 LAB — RPR: RPR Ser Ql: NONREACTIVE

## 2020-06-29 ENCOUNTER — Encounter: Payer: Self-pay | Admitting: Infectious Disease

## 2020-06-29 ENCOUNTER — Ambulatory Visit: Payer: BC Managed Care – PPO | Admitting: Infectious Disease

## 2020-06-29 ENCOUNTER — Other Ambulatory Visit: Payer: Self-pay

## 2020-06-29 VITALS — BP 150/98 | HR 77 | Temp 98.3°F | Wt 275.0 lb

## 2020-06-29 DIAGNOSIS — B20 Human immunodeficiency virus [HIV] disease: Secondary | ICD-10-CM | POA: Diagnosis not present

## 2020-06-29 DIAGNOSIS — I10 Essential (primary) hypertension: Secondary | ICD-10-CM

## 2020-06-29 DIAGNOSIS — R635 Abnormal weight gain: Secondary | ICD-10-CM

## 2020-06-29 DIAGNOSIS — J301 Allergic rhinitis due to pollen: Secondary | ICD-10-CM

## 2020-06-29 DIAGNOSIS — Z7185 Encounter for immunization safety counseling: Secondary | ICD-10-CM

## 2020-06-29 MED ORDER — BIKTARVY 50-200-25 MG PO TABS
1.0000 | ORAL_TABLET | Freq: Every day | ORAL | 4 refills | Status: DC
Start: 2020-06-29 — End: 2021-06-26

## 2020-06-29 NOTE — Progress Notes (Signed)
Subjective:  Plan follow-up for HIV disease on medications  Chief complaitn followup for HIV  Patient ID: Thomas Armstrong, male    DOB: Jan 26, 1971, 49 y.o.   MRN: [id number]  HPI  49  year-old man doing extremely well on his current antiretroviral regimen of Atripla with an undetectable viral load and healthy CD4 count.  We switched him over to [facility] after we had had genotype archive performed that was itself undoubtedly erroneous see prior notes.  He has continued to maintain perfect virological suppression on Biktarvy.  He had lost weight through dieting largely but also some exercise.  He had been on an ACE inhibitor but this was stopped because his blood pressure normalized after he lost weight.  Unfortunately is put the weight back on again and is now hypertensive in the clinic with a blood pressure of 150/90 here.  He did have COVID-19 infection and for that reason is saying he would prefer not to get the vaccine yet.  I counseled him that he should still get it because natural immunity is certainly not sufficient against omicron and likely not against delta either.  Said he would consider so doing and recommended Moderna for him.           Past Medical History:  Diagnosis Date  . Anxiety   . Depression   . HIV infection ([place])   . HTN, white coat 02/21/2016  . Hypertension   . Hypertension 04/30/2017    No past surgical history on file.  Family History  Problem Relation Age of Onset  . Stroke Mother   . Stroke Father   . Heart disease Paternal Grandmother   . Stroke Paternal Grandmother       Social History   Socioeconomic History  . Marital status: Married    Spouse name: Not on file  . Number of children: Not on file  . Years of education: Not on file  . Highest education level: Not on file  Occupational History  . Not on file  Tobacco Use  . Smoking status: Former Research scientist (life sciences)  . Smokeless tobacco: Never Used  Substance and Sexual Activity  .  Alcohol use: Yes    Alcohol/week: 0.0 standard drinks    Comment: rarely  . Drug use: No  . Sexual activity: Yes    Comment: declined condoms  Other Topics Concern  . Not on file  Social History Narrative  . Not on file   Social Determinants of Health   Financial Resource Strain: Not on file  Food Insecurity: Not on file  Transportation Needs: Not on file  Physical Activity: Not on file  Stress: Not on file  Social Connections: Not on file    Allergies  Allergen Reactions  . Codeine     REACTION: makes pt very sleepy     Current Outpatient Medications:  .  cetirizine (ZYRTEC) 10 MG tablet, Take 10 mg by mouth daily., Disp: , Rfl:  .  bictegravir-emtricitabine-tenofovir AF (BIKTARVY) 50-200-25 MG TABS tablet, Take 1 tablet by mouth daily., Disp: 90 tablet, Rfl: 4 .  fluorouracil (EFUDEX) 5 % cream, , Disp: , Rfl:  .  lisinopril (ZESTRIL) 20 MG tablet, Take 20 mg by mouth daily., Disp: , Rfl:       Review of Systems  Constitutional: Negative for activity change, appetite change, chills, diaphoresis, fatigue, fever and unexpected weight change.  HENT: Negative for congestion, rhinorrhea, sinus pressure, sneezing, sore throat and trouble swallowing.   Eyes: Negative for photophobia and  visual disturbance.  Respiratory: Negative for cough, chest tightness, shortness of breath, wheezing and stridor.   Cardiovascular: Negative for chest pain, palpitations and leg swelling.  Gastrointestinal: Negative for abdominal distention, abdominal pain, anal bleeding, blood in stool, constipation, diarrhea, nausea and vomiting.  Genitourinary: Negative for difficulty urinating, dysuria, flank pain and hematuria.  Musculoskeletal: Negative for arthralgias, back pain, gait problem, joint swelling and myalgias.  Skin: Negative for color change, pallor, rash and wound.  Neurological: Negative for dizziness, tremors, weakness and light-headedness.  Hematological: Negative for adenopathy. Does  not bruise/bleed easily.  Psychiatric/Behavioral: Negative for agitation, behavioral problems, confusion, decreased concentration, dysphoric mood, hallucinations and sleep disturbance. The patient is not hyperactive.           Objective:   Physical Exam Vitals and nursing note reviewed.  Constitutional:      General: He is not in acute distress.    Appearance: He is well-developed. He is not diaphoretic.  HENT:     Head: Normocephalic and atraumatic.     Mouth/Throat:     Pharynx: No oropharyngeal exudate.  Eyes:     General: No scleral icterus.    Conjunctiva/sclera: Conjunctivae normal.     Pupils: Pupils are equal, round, and reactive to light.  Neck:     Thyroid: No thyromegaly.     Vascular: No JVD.  Cardiovascular:     Rate and Rhythm: Normal rate and regular rhythm.     Heart sounds: Normal heart sounds.  Pulmonary:     Effort: Pulmonary effort is normal. No respiratory distress.     Breath sounds: No wheezing.  Abdominal:     General: There is no distension.  Musculoskeletal:        General: No tenderness.     Cervical back: Normal range of motion and neck supple.  Lymphadenopathy:     Cervical: No cervical adenopathy.  Skin:    General: Skin is warm and dry.     Coloration: Skin is not pale.     Findings: No erythema.  Neurological:     General: No focal deficit present.     Mental Status: He is alert and oriented to person, place, and time.     Motor: No abnormal muscle tone.     Coordination: Coordination normal.     Deep Tendon Reflexes: Reflexes are normal and symmetric.  Psychiatric:        Mood and Affect: Mood normal.        Behavior: Behavior normal.        Thought Content: Thought content normal.        Judgment: Judgment normal.           Assessment & Plan:  HIV: Continue Biktarvy and return to clinic in a year.  Note he had originally been on a multidrug protease inhibitor based regimen when he was first started on medications that he  cannot remember the names of the medications he then eventually moved over to [place] before we changed him to [place] he had lost weight with HIV initially but then gained it back he does not correlate weight gain with his antivirals  Weight gain: He is seen no correlation with changes in antiretrovirals and says he is always battled with his weight being in the range that we have seen here in clinic.  HTN: Blood pressure restart lisinopril Vitals:   06/29/20 0936  BP: (!) 150/98  Pulse: 77  Temp: 98.3 F (36.8 C)   Seasonal allergies on cetirizine  Hyperlipidemia: He does not yet meet criteria for statin based on traditional guidelines though perhaps the REPRIEVE study will change how we consider statins in ppl living with HIV  I spent greater than 40 minutes with the patient including greater than 50% of time in face to face counsel of the patient .  Regarding his HIV history antiretrovirals, history of weight gain hypertension risk for cardiovascular disease and vaccine counseling and coordination of his care

## 2020-07-26 ENCOUNTER — Encounter (HOSPITAL_COMMUNITY): Payer: Self-pay

## 2020-07-26 ENCOUNTER — Emergency Department (HOSPITAL_COMMUNITY): Payer: BLUE CROSS/BLUE SHIELD

## 2020-07-26 ENCOUNTER — Inpatient Hospital Stay (HOSPITAL_COMMUNITY)
Admission: EM | Admit: 2020-07-26 | Discharge: 2020-07-27 | DRG: 177 | Disposition: A | Discharge status: Home / Self Care | Payer: BLUE CROSS/BLUE SHIELD | Attending: Family Medicine | Admitting: Family Medicine

## 2020-07-26 ENCOUNTER — Other Ambulatory Visit: Payer: Self-pay

## 2020-07-26 DIAGNOSIS — Z87891 Personal history of nicotine dependence: Secondary | ICD-10-CM

## 2020-07-26 DIAGNOSIS — J1282 Pneumonia due to coronavirus disease 2019: Secondary | ICD-10-CM | POA: Diagnosis present

## 2020-07-26 DIAGNOSIS — Z21 Asymptomatic human immunodeficiency virus [HIV] infection status: Secondary | ICD-10-CM | POA: Diagnosis present

## 2020-07-26 DIAGNOSIS — E876 Hypokalemia: Secondary | ICD-10-CM | POA: Diagnosis present

## 2020-07-26 DIAGNOSIS — J9601 Acute respiratory failure with hypoxia: Secondary | ICD-10-CM | POA: Diagnosis not present

## 2020-07-26 DIAGNOSIS — B20 Human immunodeficiency virus [HIV] disease: Secondary | ICD-10-CM

## 2020-07-26 DIAGNOSIS — E860 Dehydration: Secondary | ICD-10-CM | POA: Diagnosis present

## 2020-07-26 DIAGNOSIS — E669 Obesity, unspecified: Secondary | ICD-10-CM | POA: Diagnosis not present

## 2020-07-26 DIAGNOSIS — R739 Hyperglycemia, unspecified: Secondary | ICD-10-CM | POA: Diagnosis not present

## 2020-07-26 DIAGNOSIS — N179 Acute kidney failure, unspecified: Secondary | ICD-10-CM | POA: Diagnosis not present

## 2020-07-26 DIAGNOSIS — Z6832 Body mass index (BMI) 32.0-32.9, adult: Secondary | ICD-10-CM | POA: Diagnosis not present

## 2020-07-26 DIAGNOSIS — R000 Tachycardia, unspecified: Secondary | ICD-10-CM | POA: Diagnosis not present

## 2020-07-26 DIAGNOSIS — J9600 Acute respiratory failure, unspecified whether with hypoxia or hypercapnia: Secondary | ICD-10-CM | POA: Diagnosis present

## 2020-07-26 DIAGNOSIS — J302 Other seasonal allergic rhinitis: Secondary | ICD-10-CM | POA: Diagnosis present

## 2020-07-26 DIAGNOSIS — Z720 Tobacco use: Secondary | ICD-10-CM | POA: Diagnosis not present

## 2020-07-26 DIAGNOSIS — Z7952 Long term (current) use of systemic steroids: Secondary | ICD-10-CM | POA: Diagnosis not present

## 2020-07-26 DIAGNOSIS — E871 Hypo-osmolality and hyponatremia: Secondary | ICD-10-CM | POA: Diagnosis not present

## 2020-07-26 DIAGNOSIS — J189 Pneumonia, unspecified organism: Secondary | ICD-10-CM | POA: Diagnosis not present

## 2020-07-26 DIAGNOSIS — F419 Anxiety disorder, unspecified: Secondary | ICD-10-CM | POA: Diagnosis present

## 2020-07-26 DIAGNOSIS — Z79899 Other long term (current) drug therapy: Secondary | ICD-10-CM

## 2020-07-26 DIAGNOSIS — R0902 Hypoxemia: Secondary | ICD-10-CM | POA: Diagnosis not present

## 2020-07-26 DIAGNOSIS — Z885 Allergy status to narcotic agent status: Secondary | ICD-10-CM | POA: Diagnosis not present

## 2020-07-26 DIAGNOSIS — U071 COVID-19: Principal | ICD-10-CM | POA: Diagnosis present

## 2020-07-26 DIAGNOSIS — R7303 Prediabetes: Secondary | ICD-10-CM | POA: Diagnosis not present

## 2020-07-26 DIAGNOSIS — Z8249 Family history of ischemic heart disease and other diseases of the circulatory system: Secondary | ICD-10-CM

## 2020-07-26 DIAGNOSIS — R0602 Shortness of breath: Secondary | ICD-10-CM | POA: Diagnosis not present

## 2020-07-26 DIAGNOSIS — F32A Depression, unspecified: Secondary | ICD-10-CM | POA: Diagnosis not present

## 2020-07-26 DIAGNOSIS — I10 Essential (primary) hypertension: Secondary | ICD-10-CM | POA: Diagnosis present

## 2020-07-26 DIAGNOSIS — Z823 Family history of stroke: Secondary | ICD-10-CM | POA: Diagnosis not present

## 2020-07-26 LAB — COMPREHENSIVE METABOLIC PANEL WITH GFR
ALT: 38 U/L (ref 0–44)
AST: 59 U/L — ABNORMAL HIGH (ref 15–41)
Albumin: 3.8 g/dL (ref 3.5–5.0)
Alkaline Phosphatase: 46 U/L (ref 38–126)
Anion gap: 13 (ref 5–15)
BUN: 16 mg/dL (ref 6–20)
CO2: 26 mmol/L (ref 22–32)
Calcium: 8.6 mg/dL — ABNORMAL LOW (ref 8.9–10.3)
Chloride: 95 mmol/L — ABNORMAL LOW (ref 98–111)
Creatinine, Ser: 1.28 mg/dL — ABNORMAL HIGH (ref 0.61–1.24)
GFR, Estimated: >60 mL/min
Glucose, Bld: 107 mg/dL — ABNORMAL HIGH (ref 70–99)
Potassium: 3.3 mmol/L — ABNORMAL LOW (ref 3.5–5.1)
Sodium: 134 mmol/L — ABNORMAL LOW (ref 135–145)
Total Bilirubin: 0.5 mg/dL (ref 0.3–1.2)
Total Protein: 7.2 g/dL (ref 6.5–8.1)

## 2020-07-26 LAB — CBC WITH DIFFERENTIAL/PLATELET
Abs Immature Granulocytes: 0.01 10*3/uL (ref 0.00–0.07)
Basophils Absolute: 0 10*3/uL (ref 0.0–0.1)
Basophils Relative: 0 %
Eosinophils Absolute: 0 10*3/uL (ref 0.0–0.5)
Eosinophils Relative: 0 %
HCT: 47.7 % (ref 39.0–52.0)
Hemoglobin: 15.8 g/dL (ref 13.0–17.0)
Immature Granulocytes: 0 %
Lymphocytes Relative: 13 %
Lymphs Abs: 0.5 10*3/uL — ABNORMAL LOW (ref 0.7–4.0)
MCH: 29.3 pg (ref 26.0–34.0)
MCHC: 33.1 g/dL (ref 30.0–36.0)
MCV: 88.5 fL (ref 80.0–100.0)
Monocytes Absolute: 0.1 10*3/uL (ref 0.1–1.0)
Monocytes Relative: 4 %
Neutro Abs: 3.4 10*3/uL (ref 1.7–7.7)
Neutrophils Relative %: 83 %
Platelets: 166 10*3/uL (ref 150–400)
RBC: 5.39 MIL/uL (ref 4.22–5.81)
RDW: 14.6 % (ref 11.5–15.5)
WBC: 4 10*3/uL (ref 4.0–10.5)
nRBC: 0 % (ref 0.0–0.2)

## 2020-07-26 LAB — CBC
HCT: 46.4 % (ref 39.0–52.0)
Hemoglobin: 15.2 g/dL (ref 13.0–17.0)
MCH: 29.2 pg (ref 26.0–34.0)
MCHC: 32.8 g/dL (ref 30.0–36.0)
MCV: 89.2 fL (ref 80.0–100.0)
Platelets: 166 10*3/uL (ref 150–400)
RBC: 5.2 MIL/uL (ref 4.22–5.81)
RDW: 14.4 % (ref 11.5–15.5)
WBC: 4.4 10*3/uL (ref 4.0–10.5)
nRBC: 0 % (ref 0.0–0.2)

## 2020-07-26 LAB — LACTIC ACID, PLASMA
Lactic Acid, Venous: 1 mmol/L (ref 0.5–1.9)
Lactic Acid, Venous: 1.1 mmol/L (ref 0.5–1.9)

## 2020-07-26 LAB — CREATININE, SERUM
Creatinine, Ser: 1.38 mg/dL — ABNORMAL HIGH (ref 0.61–1.24)
GFR, Estimated: >60 mL/min (ref >60)

## 2020-07-26 LAB — LACTATE DEHYDROGENASE: LDH: 343 U/L — ABNORMAL HIGH (ref 98–192)

## 2020-07-26 LAB — PROCALCITONIN: Procalcitonin: <0.1 ng/mL

## 2020-07-26 LAB — D-DIMER, QUANTITATIVE: D-Dimer, Quant: 0.56 ug{FEU}/mL — ABNORMAL HIGH (ref 0.00–0.50)

## 2020-07-26 LAB — C-REACTIVE PROTEIN: CRP: 4.1 mg/dL — ABNORMAL HIGH

## 2020-07-26 LAB — TRIGLYCERIDES: Triglycerides: 96 mg/dL

## 2020-07-26 LAB — POC SARS CORONAVIRUS 2 AG -  ED: SARS Coronavirus 2 Ag: POSITIVE — AB

## 2020-07-26 LAB — FERRITIN: Ferritin: 391 ng/mL — ABNORMAL HIGH (ref 24–336)

## 2020-07-26 LAB — FIBRINOGEN: Fibrinogen: 686 mg/dL — ABNORMAL HIGH (ref 210–475)

## 2020-07-26 MED ORDER — BICTEGRAVIR-EMTRICITAB-TENOFOV 50-200-25 MG PO TABS
1.0000 | ORAL_TABLET | Freq: Every day | ORAL | Status: DC
  Administered 2020-07-26 – 2020-07-27 (×2): 1 via ORAL
  Filled 2020-07-26 (×2): qty 1

## 2020-07-26 MED ORDER — SODIUM CHLORIDE 0.9 % IV SOLN: 100.0000 mg | Freq: Every day | INTRAVENOUS | Status: DC

## 2020-07-26 MED ORDER — SODIUM CHLORIDE 0.9 % IV SOLN: 200.0000 mg | Freq: Once | INTRAVENOUS | Status: DC

## 2020-07-26 MED ORDER — ENOXAPARIN SODIUM 60 MG/0.6ML ~~LOC~~ SOLN
60.0000 mg | SUBCUTANEOUS | Status: DC
  Administered 2020-07-26: 60 mg via SUBCUTANEOUS
  Filled 2020-07-26 (×2): qty 0.6

## 2020-07-26 MED ORDER — ACETAMINOPHEN 325 MG PO TABS
650.0000 mg | ORAL_TABLET | Freq: Once | ORAL | Status: AC
  Administered 2020-07-26: 650 mg via ORAL
  Filled 2020-07-26: qty 2

## 2020-07-26 MED ORDER — POTASSIUM CHLORIDE CRYS ER 20 MEQ PO TBCR
40.0000 meq | EXTENDED_RELEASE_TABLET | Freq: Once | ORAL | Status: AC
  Administered 2020-07-26: 40 meq via ORAL
  Filled 2020-07-26: qty 4

## 2020-07-26 MED ORDER — ONDANSETRON HCL 4 MG/2ML IJ SOLN: 4.0000 mg | Freq: Four times a day (QID) | INTRAMUSCULAR | Status: DC | PRN

## 2020-07-26 MED ORDER — GUAIFENESIN-DM 100-10 MG/5ML PO SYRP: 10.0000 mL | ORAL_SOLUTION | ORAL | Status: DC | PRN

## 2020-07-26 MED ORDER — ONDANSETRON HCL 4 MG PO TABS: 4.0000 mg | ORAL_TABLET | Freq: Four times a day (QID) | ORAL | Status: DC | PRN

## 2020-07-26 MED ORDER — ZINC SULFATE 220 (50 ZN) MG PO CAPS
220.0000 mg | ORAL_CAPSULE | Freq: Every day | ORAL | Status: DC
  Administered 2020-07-26 – 2020-07-27 (×2): 220 mg via ORAL
  Filled 2020-07-26 (×2): qty 1

## 2020-07-26 MED ORDER — METHYLPREDNISOLONE SODIUM SUCC 125 MG IJ SOLR
0.5000 mg/kg | Freq: Two times a day (BID) | INTRAMUSCULAR | Status: DC
  Administered 2020-07-26 – 2020-07-27 (×2): 58.125 mg via INTRAVENOUS
  Filled 2020-07-26 (×2): qty 2

## 2020-07-26 MED ORDER — ASCORBIC ACID 500 MG PO TABS
500.0000 mg | ORAL_TABLET | Freq: Every day | ORAL | Status: DC
  Administered 2020-07-26 – 2020-07-27 (×2): 500 mg via ORAL
  Filled 2020-07-26 (×3): qty 1

## 2020-07-26 MED ORDER — DEXAMETHASONE SODIUM PHOSPHATE 10 MG/ML IJ SOLN
6.0000 mg | Freq: Once | INTRAMUSCULAR | Status: AC
  Administered 2020-07-26: 6 mg via INTRAVENOUS
  Filled 2020-07-26: qty 1

## 2020-07-26 MED ORDER — ACETAMINOPHEN 325 MG PO TABS: 650.0000 mg | ORAL_TABLET | Freq: Four times a day (QID) | ORAL | Status: DC | PRN

## 2020-07-26 MED ORDER — SODIUM CHLORIDE 0.9 % IV SOLN: INTRAVENOUS | Status: DC

## 2020-07-26 MED ORDER — SODIUM CHLORIDE 0.9 % IV SOLN
100.0000 mg | Freq: Every day | INTRAVENOUS | Status: DC
  Administered 2020-07-27: 100 mg via INTRAVENOUS
  Filled 2020-07-26: qty 20

## 2020-07-26 MED ORDER — PREDNISONE 50 MG PO TABS: 50.0000 mg | ORAL_TABLET | Freq: Every day | ORAL | Status: DC

## 2020-07-26 MED ORDER — SODIUM CHLORIDE 0.9 % IV SOLN
100.0000 mg | INTRAVENOUS | Status: AC
  Administered 2020-07-26: 100 mg via INTRAVENOUS
  Filled 2020-07-26 (×2): qty 20

## 2020-07-26 MED ORDER — IPRATROPIUM-ALBUTEROL 20-100 MCG/ACT IN AERS
1.0000 | INHALATION_SPRAY | Freq: Four times a day (QID) | RESPIRATORY_TRACT | Status: DC
  Administered 2020-07-26 – 2020-07-27 (×4): 1 via RESPIRATORY_TRACT
  Filled 2020-07-26: qty 4

## 2020-07-26 MED ORDER — LORATADINE 10 MG PO TABS
10.0000 mg | ORAL_TABLET | Freq: Every day | ORAL | Status: DC
  Administered 2020-07-26 – 2020-07-27 (×2): 10 mg via ORAL
  Filled 2020-07-26 (×2): qty 1

## 2020-07-26 NOTE — H&P (Signed)
History and Physical    Thomas Armstrong [id number] DOB: 1971-03-17 DOA: 07/26/2020  PCP: [name], MD   Patient coming from: home  I have personally briefly reviewed patient's old medical records in [place]  Chief Complaint: Shortness of breath, productive cough, general malaise and fever  HPI: Thomas Armstrong is a 50 y.o. male with medical history significant of HIV, depression/anxiety, hypertension and class I obesity; who presented to the emergency department secondary to symptoms as mentioned above.  He expressed volume with shortness of breath, productive cough and chills for about a week now; he has been tested at home for COVID which turned out to be positive.  Symptom has progressed, and on the day of admission he was having high-grade temperature, ongoing productive coughing spells and increased shortness of breath at rest and with activity.  Patient found to be with oxygen saturation in the 87-88 range on room air.  No chest pain, no nausea, no vomiting, no diarrhea, no dysuria, no hematuria, no focal weakness, no melena or hematochezia.  ED Course: Chest x-ray demonstrating bilateral infiltrates, patient was hypoxic at 88% on RA (improved to 92-94% on 2L [place]); blood work demonstrating acute kidney injury, D-dimer 0.56, ferritin 39 1, LDH 343, CRP 4.1.  Patient received IV Decadron and remdesivir.  TRH has been contacted to place patient in the hospital for further evaluation and management.  Review of Systems: As per HPI otherwise all other systems reviewed and are negative.   Past Medical History:  Diagnosis Date  . Anxiety   . Depression   . HIV infection ([place])   . HTN, white coat 02/21/2016  . Hypertension   . Hypertension 04/30/2017    History reviewed. No pertinent surgical history.  Social History  reports that he has quit smoking. He has never used smokeless tobacco. He reports current alcohol use. He reports that he does not use drugs.  Allergies   Allergen Reactions  . Codeine     REACTION: makes pt very sleepy    Family History  Problem Relation Age of Onset  . Stroke Mother   . Stroke Father   . Heart disease Paternal Grandmother   . Stroke Paternal Grandmother      Prior to Admission medications   Medication Sig Start Date End Date Taking? Authorizing Provider  bictegravir-emtricitabine-tenofovir AF (BIKTARVY) 50-200-25 MG TABS tablet Take 1 tablet by mouth daily. 06/29/20   Truman Hayward, MD  cetirizine (ZYRTEC) 10 MG tablet Take 10 mg by mouth daily.    [provider]  fluorouracil (EFUDEX) 5 % cream  07/12/19   [provider]  lisinopril (ZESTRIL) 20 MG tablet Take 20 mg by mouth daily. 07/04/19   [provider]    Physical Exam: Vitals:   07/26/20 1130 07/26/20 1200 07/26/20 1230 07/26/20 1239  BP: 122/78 (!) 86/50 105/60   Pulse: (!) 117 (!) 103 (!) 110   Resp: 18 15 14    Temp:    99.1 F (37.3 C)  TempSrc:    Oral  SpO2: 90% 94% 94% 94%    Constitutional: Warm to touch; no chest pain, no nausea or vomiting.  Reports general malaise and just not feeling right. Vitals:   07/26/20 1130 07/26/20 1200 07/26/20 1230 07/26/20 1239  BP: 122/78 (!) 86/50 105/60   Pulse: (!) 117 (!) 103 (!) 110   Resp: 18 15 14    Temp:    99.1 F (37.3 C)  TempSrc:  Oral  SpO2: 90% 94% 94% 94%   Eyes: PERRL, lids and conjunctivae normal; no icterus. ENMT: Mucous membranes are moist. Posterior pharynx clear of any exudate or lesions.  Neck: normal, supple, no masses, no thyromegaly; no JVD. Respiratory: Positive rhonchi bilaterally; no using accessory muscles.  No wheezing or crackles. Cardiovascular: Sinus tachycardia, no rubs, no gallops, no murmurs.  No lower extremity edema appreciated. Abdomen: no tenderness, no masses palpated. No hepatosplenomegaly. Bowel sounds positive.  Musculoskeletal: no clubbing / cyanosis. No joint deformity upper and lower extremities. Good ROM, no  contractures.  Skin: no rashes, no petechiae. Neurologic: CN 2-12 grossly intact. Sensation intact, DTR normal. Strength 5/5 in all 4.  Psychiatric: Normal judgment and insight. Alert and oriented x 3. Normal mood.   Labs on Admission: I have personally reviewed following labs and imaging studies  CBC: Recent Labs  Lab 07/26/20 1135  WBC 4.0  NEUTROABS 3.4  HGB 15.8  HCT 47.7  MCV 88.5  PLT 275    Basic Metabolic Panel: Recent Labs  Lab 07/26/20 1135  NA 134*  K 3.3*  CL 95*  CO2 26  GLUCOSE 107*  BUN 16  CREATININE 1.28*  CALCIUM 8.6*    GFR: CrCl cannot be calculated (Unknown ideal weight.).  Liver Function Tests: Recent Labs  Lab 07/26/20 1135  AST 59*  ALT 38  ALKPHOS 46  BILITOT 0.5  PROT 7.2  ALBUMIN 3.8    Urine analysis:    Component Value Date/Time   COLORURINE YELLOW 11/15/2009 [place] 11/15/2009 1821   LABSPEC 1.030 11/15/2009 1821   PHURINE 7.0 11/15/2009 1821   GLUCOSEU 100 (A) 11/15/2009 1821   BILIRUBINUR NEG 11/15/2009 1821   KETONESUR NEG mg/dL 11/15/2009 1821   PROTEINUR NEG mg/dL 11/15/2009 1821   UROBILINOGEN 0.2 11/15/2009 1821   NITRITE NEG 11/15/2009 1821   LEUKOCYTESUR NEG 11/15/2009 1821    Radiological Exams on Admission: DG Chest Port 1 View  Result Date: 07/26/2020 CLINICAL DATA:  COVID positive EXAM: PORTABLE CHEST 1 VIEW COMPARISON:  None. FINDINGS: Normal cardiac silhouette. There is patchy bilateral airspace disease. No pleural fluid. No pneumothorax. No acute osseous abnormality. IMPRESSION: Patchy bilateral airspace disease consistent with moderate severity COVID viral pneumonia. Electronically Signed   By: Suzy Bouchard M.D.   On: 07/26/2020 10:47    EKG: Independently reviewed.,  Normal QT.  No ischemic changes.  Assessment/Plan 1-acute respiratory failure with hypoxia secondary to COVID-19 pneumonia: -Requiring 2 L nasal supplementation -Chest x-ray with bilateral infiltrate -Patient  will be started on steroids and remdesivir -Wean oxygen supplementation as tolerated -Incentive spirometer, flutter valve, vitamin C, zinc and as needed antitussive medication will be provided -Continue to follow inflammatory markers -Will provide supportive care and follow clinical response.  2-AKI -In the setting of prerenal azotemia most likely -Will provide fluid resuscitation -No complaints of dysuria -Follow renal function trend.  3-history of HIV -Continue outpatient follow-up with infectious disease service -Continue treatment with Biktarvy   4-hypokalemia/hyponatremia -will replete and follow electrolytes trend -checking Magnesium and phosphorus level -Gentle fluid resuscitation will be provided.  5-HTN -stable and well controlled -holding lisinopril in the setting of AKI -will maintain adequate hydration and follow-up vital signs.  6-seasonal allergies -Continue daily loratadine.   VT prophylaxis: Lovenox Code Status:   Full code Family Communication:  No family at bedside. Disposition Plan:   Patient is from:  Home  Anticipated DC to:  Home  Anticipated DC date:  3 days  Anticipated DC barriers: Stabilization of respiratory status. Consults called:  None Admission status:  MedSurg, inpatient status; length of stay: 2 midnights.  Severity of Illness: Moderate illness; patient presenting with high-grade temperature, general malaise, intermittent productive cough and hypoxia.  Found to be positive for COVID.  Chest x-ray with bilateral infiltrates characteristic of COVID infection; requiring 2 L nasal cannula supplementation.  Will be admitted for IV steroids, remdesivir, inhaler and oxygen management.    Barton Dubois MD [facility]  How to contact the [facility] Attending or Consulting provider [place] or covering provider during after hours [place], for this patient?   1. Check the care team in [facility] and look for a) attending/consulting [facility] provider listed  and b) the [facility] team listed 2. Log into [email or web address] and use [place] universal password to access. If you do not have the password, please contact the hospital operator. 3. Locate the [facility] provider you are looking for under [facility] and page to a number that you can be directly reached. 4. If you still have difficulty reaching the provider, please page the [facility] (Director on Call) for the Hospitalists listed on amion for assistance.  07/26/2020, 1:10 PM

## 2020-07-26 NOTE — ED Triage Notes (Addendum)
Pt positive for covid , by self test last Wednesday. Pt states he just doesn't feel good. Pt reports that he is nauseated, no vomiting.Temp 103.2

## 2020-07-27 ENCOUNTER — Encounter (HOSPITAL_COMMUNITY): Payer: Self-pay | Admitting: Internal Medicine

## 2020-07-27 LAB — COMPREHENSIVE METABOLIC PANEL
ALT: 43 U/L (ref 0–44)
AST: 54 U/L — ABNORMAL HIGH (ref 15–41)
Albumin: 3.5 g/dL (ref 3.5–5.0)
Alkaline Phosphatase: 49 U/L (ref 38–126)
Anion gap: 10 (ref 5–15)
BUN: 21 mg/dL — ABNORMAL HIGH (ref 6–20)
CO2: 26 mmol/L (ref 22–32)
Calcium: 8.9 mg/dL (ref 8.9–10.3)
Chloride: 101 mmol/L (ref 98–111)
Creatinine, Ser: 1.08 mg/dL (ref 0.61–1.24)
GFR, Estimated: >60 mL/min (ref >60)
Glucose, Bld: 138 mg/dL — ABNORMAL HIGH (ref 70–99)
Potassium: 3.3 mmol/L — ABNORMAL LOW (ref 3.5–5.1)
Sodium: 137 mmol/L (ref 135–145)
Total Bilirubin: 0.3 mg/dL (ref 0.3–1.2)
Total Protein: 7.1 g/dL (ref 6.5–8.1)

## 2020-07-27 LAB — PHOSPHORUS: Phosphorus: 2 mg/dL — ABNORMAL LOW (ref 2.5–4.6)

## 2020-07-27 LAB — CBC WITH DIFFERENTIAL/PLATELET
Abs Immature Granulocytes: 0.03 10*3/uL (ref 0.00–0.07)
Basophils Absolute: 0 10*3/uL (ref 0.0–0.1)
Basophils Relative: 0 %
Eosinophils Absolute: 0 10*3/uL (ref 0.0–0.5)
Eosinophils Relative: 0 %
HCT: 50.5 % (ref 39.0–52.0)
Hemoglobin: 16.5 g/dL (ref 13.0–17.0)
Immature Granulocytes: 1 %
Lymphocytes Relative: 11 %
Lymphs Abs: 0.6 10*3/uL — ABNORMAL LOW (ref 0.7–4.0)
MCH: 29.3 pg (ref 26.0–34.0)
MCHC: 32.7 g/dL (ref 30.0–36.0)
MCV: 89.5 fL (ref 80.0–100.0)
Monocytes Absolute: 0.2 10*3/uL (ref 0.1–1.0)
Monocytes Relative: 3 %
Neutro Abs: 5 10*3/uL (ref 1.7–7.7)
Neutrophils Relative %: 85 %
Platelets: 180 10*3/uL (ref 150–400)
RBC: 5.64 MIL/uL (ref 4.22–5.81)
RDW: 14.6 % (ref 11.5–15.5)
WBC: 5.9 10*3/uL (ref 4.0–10.5)
nRBC: 0 % (ref 0.0–0.2)

## 2020-07-27 LAB — FERRITIN: Ferritin: 562 ng/mL — ABNORMAL HIGH (ref 24–336)

## 2020-07-27 LAB — C-REACTIVE PROTEIN: CRP: 3.6 mg/dL — ABNORMAL HIGH (ref <1.0)

## 2020-07-27 LAB — D-DIMER, QUANTITATIVE: D-Dimer, Quant: 0.35 ug/mL-FEU (ref 0.00–0.50)

## 2020-07-27 LAB — MAGNESIUM: Magnesium: 2.2 mg/dL (ref 1.7–2.4)

## 2020-07-27 MED ORDER — PREDNISONE 50 MG PO TABS: 50.0000 mg | ORAL_TABLET | Freq: Every day | ORAL | 0 refills | Status: DC

## 2020-07-27 MED ORDER — GUAIFENESIN-DM 100-10 MG/5ML PO SYRP: 10.0000 mL | ORAL_SOLUTION | ORAL | 0 refills | Status: DC | PRN

## 2020-07-27 MED ORDER — ALBUTEROL SULFATE HFA 108 (90 BASE) MCG/ACT IN AERS: 2.0000 | INHALATION_SPRAY | RESPIRATORY_TRACT | 1 refills | Status: DC | PRN

## 2020-07-27 MED ORDER — POTASSIUM CHLORIDE CRYS ER 20 MEQ PO TBCR
60.0000 meq | EXTENDED_RELEASE_TABLET | Freq: Once | ORAL | Status: AC
  Administered 2020-07-27: 60 meq via ORAL
  Filled 2020-07-27: qty 3

## 2020-07-27 MED ORDER — SODIUM CHLORIDE 0.9 % IV SOLN: INTRAVENOUS | Status: DC

## 2020-07-27 MED ORDER — ASCORBIC ACID 500 MG PO TABS: 500.0000 mg | ORAL_TABLET | Freq: Every day | ORAL | 0 refills | Status: DC

## 2020-07-27 MED ORDER — ZINC SULFATE 220 (50 ZN) MG PO CAPS
220.0000 mg | ORAL_CAPSULE | Freq: Every day | ORAL | 0 refills | Status: AC
Start: 2020-07-28 — End: ?

## 2020-07-27 NOTE — ED Notes (Signed)
SATURATION QUALIFICATIONS: (This note is used to comply with regulatory documentation for home oxygen)  Patient Saturations on Room Air at Rest = 88%  Patient Saturations on Room Air while Ambulating = 86%  Patient Saturations on 3 Liters of oxygen while Ambulating = 91%  Please briefly explain why patient needs home oxygen: pt had some sob while ambulating during ambulation off of oxygen

## 2020-07-27 NOTE — Discharge Instructions (Signed)
You are scheduled for an outpatient Remdesivir infusion at 3:30pm on Friday 1/21, Saturday 1/22, and Monday 1/24 at [facility]. Please park at [place], as staff will be escorting you through the [place] entrance of the hospital. Appointments take approximately 45 minutes.    The address for the infusion clinic site is:  --GPS address is [place] - the parking is located near [employer] building where you will see  COVID19 Infusion feather banner marking the entrance to parking.   (see photos below)            --Enter into the 2nd entrance where the "wave, flag banner" is at the road. Turn into this 2nd entrance and immediately turn left to park in 1 of the 5 parking spots.   --Please stay in your car and call the desk for assistance inside [phone].   The day of your visit you should:  Get plenty of rest the night before and drink plenty of water  Eat a light meal/snack before coming and take your medications as prescribed   Wear warm, comfortable clothes with a shirt that can roll-up over the elbow (will need IV start).   Wear a mask   Consider bringing some activity to help pass the time     IMPORTANT INFORMATION: PAY CLOSE ATTENTION   PHYSICIAN DISCHARGE INSTRUCTIONS  Follow with Primary care provider  [name], MD  and other consultants as instructed by your Hospitalist Physician  [place], WORSEN OR NEW PROBLEM DEVELOPS   Please note: You were cared for by a hospitalist during your hospital stay. Every effort will be made to forward records to your primary care provider.  You can request that your primary care provider send for your hospital records if they have not received them.  Once you are discharged, your primary care physician will handle any further medical issues. Please note that NO REFILLS for any discharge medications will be authorized once you are  discharged, as it is imperative that you return to your primary care physician (or establish a relationship with a primary care physician if you do not have one) for your post hospital discharge needs so that they can reassess your need for medications and monitor your lab values.  Please get a complete blood count and chemistry panel checked by your Primary MD at your next visit, and again as instructed by your Primary MD.  Get Medicines reviewed and adjusted: Please take all your medications with you for your next visit with your Primary MD  Laboratory/radiological data: Please request your Primary MD to go over all hospital tests and procedure/radiological results at the follow up, please ask your primary care provider to get all Hospital records sent to his/her office.  In some cases, they will be blood work, cultures and biopsy results pending at the time of your discharge. Please request that your primary care provider follow up on these results.  If you are diabetic, please bring your blood sugar readings with you to your follow up appointment with primary care.    Please call and make your follow up appointments as soon as possible.    Also Note the following: If you experience worsening of your admission symptoms, develop shortness of breath, life threatening emergency, suicidal or homicidal thoughts you must seek medical attention immediately by calling 911 or calling your MD immediately  if symptoms less severe.  You must read complete instructions/literature along with all the possible adverse reactions/side effects for all the Medicines you take and that have been prescribed to you. Take any new Medicines after you have completely understood and accpet all the possible adverse reactions/side effects.   Do not drive when taking Pain medications or sleeping medications (Benzodiazepines)  Do not take more than prescribed Pain, Sleep and Anxiety Medications. It is not advisable to  combine anxiety,sleep and pain medications without talking with your primary care practitioner  Special Instructions: If you have smoked or chewed Tobacco  in the last 2 yrs please stop smoking, stop any regular Alcohol  and or any Recreational drug use.  Wear Seat belts while driving.  Do not drive if taking any narcotic, mind altering or controlled substances or recreational drugs or alcohol.

## 2020-07-27 NOTE — TOC Transition Note (Signed)
Transition of Care [facility]) - CM/SW Discharge Note   Patient Details  Name: Thomas Armstrong MRN: [id number] Date of Birth: [birth year]  Transition of Care [facility] CM/SW Contact:  Boneta Lucks, RN Phone Number: 07/27/2020, 11:04 AM   Clinical Narrative:   Patient in ED with COVID, Patient medically stable to discharge home qualifying for home oxygen. Barbaraann Rondo with [employer] is on site, will deliver oxygen to ED.     Barriers to Discharge: Barriers Resolved   Patient Goals and CMS Choice Patient states their goals for this hospitalization and ongoing recovery are:: to go home. CMS Medicare.gov Compare Post Acute Care list provided to:: Patient Choice offered to / list presented to : Patient         Discharge Plan and Services             DME Arranged: Oxygen DME Agency: [employer] Date DME Agency Contacted: 07/27/20 Time DME Agency Contacted: 1103 Representative spoke with at DME Agency: Barbaraann Rondo     Readmission Risk Interventions Readmission Risk Prevention Plan 07/27/2020  Medication Screening Complete  Transportation Screening Complete  Some recent data might be hidden

## 2020-07-27 NOTE — Discharge Summary (Signed)
Physician Discharge Summary  Thomas Armstrong ZOX:[id number] DOB: 08/18/1970 DOA: 07/26/2020  PCP: Assunta FoundGolding, John, MD  Admit date: 07/26/2020 Discharge date: 07/27/2020  Admitted From:  Home  Disposition:  Home   Recommendations for Outpatient Follow-up:  1. Follow up with PCP in 2 weeks 2. Go to outpatient remdesivir treatments as arranged to complete final 3 treatments.   Patient scheduled for outpatient Remdesivir infusions at 3:30pm on Friday 1/21, Saturday 1/22, and Monday 1/24 at [facility]. Please inform the patient to park at[address], [place], as staff will be escorting the patient through the east entrance of the hospital.Appointments take approximately 45 minutes.   There is a wave flag banner located near the entrance on [address] Turn into this entranceand immediatelyturn left or right and park in 1 of the 10 designated Covid Infusion Parking spots. There is a phone number on the sign, please call and let the staff know what spot you are in and we will come out and get you. For questions call [phone]. Thanks.  Home Health:  DME Home Oxygen 3L/min   Discharge Condition: STABLE   CODE STATUS: FULL    Brief Hospitalization Summary: Please see all hospital notes, images, labs for full details of the hospitalization. ADMISSION HPI: Thomas BarJayron J Schaafsma is a 50 y.o. male with medical history significant of HIV, depression/anxiety, hypertension and class I obesity; who presented to the emergency department secondary to symptoms as mentioned above.  He expressed volume with shortness of breath, productive cough and chills for about a week now; he has been tested at home for COVID which turned out to be positive.  Symptom has progressed, and on the day of admission he was having high-grade temperature, ongoing productive coughing spells and increased shortness of breath at rest and with activity.  Patient found to be with oxygen saturation in the 87-88 range on room  air.  No chest pain, no nausea, no vomiting, no diarrhea, no dysuria, no hematuria, no focal weakness, no melena or hematochezia.  ED Course: Chest x-ray demonstrating bilateral infiltrates, patient was hypoxic at 88% on RA (improved to 92-94% on 2L [place]); blood work demonstrating acute kidney injury, D-dimer 0.56, ferritin 39 1, LDH 343, CRP 4.1.  Patient received IV Decadron and remdesivir.  TRH has been contacted to place patient in the hospital for further evaluation and management.  Hospital Course  Pt was admitted with symptomatic covid infection.  He was dehydrated with an acute kidney injury.  He was given supportive measures with IV fluids.  His COVID infection required supplemental oxygen.  He is currently requiring 3 L nasal cannula.  He is feeling much better.  He tolerated remdesivir.  He has received 2 doses of remdesivir.  He has received IV steroids.  He also received supplemental vitamins C zinc and antitussives as ordered.  He is feeling much better today.  He would like to go home.  He is agreeable to outpatient remdesivir infusions to complete course.  He will need 3 additional remdesivir treatments which have been arranged.  He reports that his wife will transport him to the appointments.  He will go home with supplemental oxygen.  He will also discharge on 8 more days of oral prednisone to complete a full 10-day course.  Albuterol HFA ordered.  Cough suppressant medications ordered.  Patient given strong instructions to return if symptoms worsen or do not improve or new problem develops.  Patient verbalized understanding.   1-acute respiratory failure with  hypoxia secondary to COVID-19 pneumonia: -Requiring 2 L nasal supplementation -Chest x-ray with bilateral infiltrate -Patient will be started on steroids and remdesivir -Wean oxygen supplementation as tolerated -Incentive spirometer, flutter valve, vitamin C, zinc and as needed antitussive medication will be provided -Continue  to follow inflammatory markers -Will provide supportive care and follow clinical response.  2-AKI - RESOLVED  -In the setting of prerenal azotemia -Treated with fluid resuscitation  3-history of HIV -Continue outpatient follow-up with infectious disease service -Continue treatment with Biktarvy   4-hypokalemia/hyponatremia - TREATED  -oral replacement given -checking Magnesium and phosphorus level -Gentle fluid resuscitation will be provided.  5-HTN -stable and well controlled -holding lisinopril in the setting of AKI -will maintain adequate hydration and follow-up vital signs.  6-seasonal allergies -Continue daily loratadine.  Discharge Diagnoses:  Active Problems:   Acute respiratory failure due to COVID-19 [facility])   Discharge Instructions:  Allergies as of 07/27/2020      Reactions   Codeine    REACTION: makes pt very sleepy      Medication List    STOP taking these medications   ibuprofen 200 MG tablet Commonly known as: ADVIL   zinc gluconate 50 MG tablet     TAKE these medications   acetaminophen 500 MG tablet Commonly known as: TYLENOL Take 1,000 mg by mouth every 6 (six) hours as needed.   albuterol 108 (90 Base) MCG/ACT inhaler Commonly known as: VENTOLIN HFA Inhale 2 puffs into the lungs every 4 (four) hours as needed for wheezing or shortness of breath.   ascorbic acid 500 MG tablet Commonly known as: VITAMIN C Take 1 tablet (500 mg total) by mouth daily. Start taking on: July 28, 2020   Biktarvy 50-200-25 MG Tabs tablet Generic drug: bictegravir-emtricitabine-tenofovir AF Take 1 tablet by mouth daily.   cetirizine 10 MG tablet Commonly known as: ZYRTEC Take 10 mg by mouth daily.   guaiFENesin-dextromethorphan 100-10 MG/5ML syrup Commonly known as: ROBITUSSIN DM Take 10 mLs by mouth every 4 (four) hours as needed for cough.   predniSONE 50 MG tablet Commonly known as: DELTASONE Take 1 tablet (50 mg total) by mouth daily with  breakfast for 8 days. Start taking on: July 28, 2020   Vitamin D3 50 MCG (2000 UT) Tabs Take 1 tablet by mouth daily.   zinc sulfate 220 (50 Zn) MG capsule Take 1 capsule (220 mg total) by mouth daily. Start taking on: July 28, 2020            Durable Medical Equipment  (From admission, onward)         Start     Ordered   07/27/20 1112  For home use only DME oxygen  Once       Question Answer Comment  Length of Need 6 Months   Mode or (Route) Nasal cannula   Liters per Minute 3   Frequency Continuous (stationary and portable oxygen unit needed)   Oxygen conserving device Yes   Oxygen delivery system Gas      07/27/20 1111          Follow-up Information    [facility] Follow up.   Why:  Home Oxygen       [name], MD. Schedule an appointment as soon as possible for a visit in 2 week(s).   Specialty: Family Medicine Contact information: [address] [place] [address] [phone]        Tommy Medal, Lavell Islam, MD .   Specialty: Infectious Diseases Contact  information: [address] [address] [phone]              Allergies  Allergen Reactions  . Codeine     REACTION: makes pt very sleepy   Allergies as of 07/27/2020      Reactions   Codeine    REACTION: makes pt very sleepy      Medication List    STOP taking these medications   ibuprofen 200 MG tablet Commonly known as: ADVIL   zinc gluconate 50 MG tablet     TAKE these medications   acetaminophen 500 MG tablet Commonly known as: TYLENOL Take 1,000 mg by mouth every 6 (six) hours as needed.   albuterol 108 (90 Base) MCG/ACT inhaler Commonly known as: VENTOLIN HFA Inhale 2 puffs into the lungs every 4 (four) hours as needed for wheezing or shortness of breath.   ascorbic acid 500 MG tablet Commonly known as: VITAMIN C Take 1 tablet (500 mg total) by mouth daily. Start taking on: July 28, 2020   Biktarvy 50-200-25 MG Tabs  tablet Generic drug: bictegravir-emtricitabine-tenofovir AF Take 1 tablet by mouth daily.   cetirizine 10 MG tablet Commonly known as: ZYRTEC Take 10 mg by mouth daily.   guaiFENesin-dextromethorphan 100-10 MG/5ML syrup Commonly known as: ROBITUSSIN DM Take 10 mLs by mouth every 4 (four) hours as needed for cough.   predniSONE 50 MG tablet Commonly known as: DELTASONE Take 1 tablet (50 mg total) by mouth daily with breakfast for 8 days. Start taking on: July 28, 2020   Vitamin D3 50 MCG (2000 UT) Tabs Take 1 tablet by mouth daily.   zinc sulfate 220 (50 Zn) MG capsule Take 1 capsule (220 mg total) by mouth daily. Start taking on: July 28, 2020            Durable Medical Equipment  (From admission, onward)         Start     Ordered   07/27/20 1112  For home use only DME oxygen  Once       Question Answer Comment  Length of Need 6 Months   Mode or (Route) Nasal cannula   Liters per Minute 3   Frequency Continuous (stationary and portable oxygen unit needed)   Oxygen conserving device Yes   Oxygen delivery system Gas      07/27/20 1111          Procedures/Studies: DG Chest Port 1 View  Result Date: 07/26/2020 CLINICAL DATA:  COVID positive EXAM: PORTABLE CHEST 1 VIEW COMPARISON:  None. FINDINGS: Normal cardiac silhouette. There is patchy bilateral airspace disease. No pleural fluid. No pneumothorax. No acute osseous abnormality. IMPRESSION: Patchy bilateral airspace disease consistent with moderate severity COVID viral pneumonia. Electronically Signed   By: Suzy Bouchard M.D.   On: 07/26/2020 10:47      Subjective: Pt says he is feeling much better today.  He would like to go home.  He is agreeable to going to outpatient remdesivir infusion center and wife will provide transportation.   Discharge Exam: Vitals:   07/27/20 1000 07/27/20 1030  BP: 127/83 124/81  Pulse: (!) 103 (!) 101  Resp: 19 14  Temp:    SpO2: 90% (!) 88%   Vitals:    07/27/20 0900 07/27/20 0930 07/27/20 1000 07/27/20 1030  BP: 125/77 129/81 127/83 124/81  Pulse: 90 92 (!) 103 (!) 101  Resp: 18 14 19 14   Temp:      TempSrc:  SpO2: (!) 89% 92% 90% (!) 88%  Weight:      Height:       General: Pt is alert, awake, not in acute distress Cardiovascular: normal S1/S2 +, no rubs, no gallops Respiratory: no increased work of breathing Abdominal: Soft, NT, ND, bowel sounds + Extremities: no edema, no cyanosis   The results of significant diagnostics from this hospitalization (including imaging, microbiology, ancillary and laboratory) are listed below for reference.     Microbiology: Recent Results (from the past 240 hour(s))  Blood Culture (routine x 2)     Status: None (Preliminary result)   Collection Time: 07/26/20 11:35 AM   Specimen: BLOOD  Result Value Ref Range Status   Specimen Description BLOOD LEFT ANTECUBITAL  Final   Special Requests BOTTLES DRAWN AEROBIC AND ANAEROBIC  Final   Culture   Final    NO GROWTH < 24 HOURS Performed at [facility], [address] [place], [place] [address]    Report Status PENDING  Incomplete  Blood Culture (routine x 2)     Status: None (Preliminary result)   Collection Time: 07/26/20 12:30 PM   Specimen: BLOOD  Result Value Ref Range Status   Specimen Description BLOOD LEFT HAND  Final   Special Requests   Final    BOTTLES DRAWN AEROBIC AND ANAEROBIC Blood Culture adequate volume   Culture   Final    NO GROWTH < 24 HOURS Performed at [facility], [address] [place], [place] [address]    Report Status PENDING  Incomplete     Labs: BNP (last 3 results) No results for input(s): BNP in the last 8760 hours. Basic Metabolic Panel: Recent Labs  Lab 07/26/20 1135 07/26/20 1350 07/27/20 0919  NA 134*  --  137  K 3.3*  --  3.3*  CL 95*  --  101  CO2 26  --  26  GLUCOSE 107*  --  138*  BUN 16  --  21*  CREATININE 1.28* 1.38* 1.08  CALCIUM 8.6*  --  8.9  MG  --   --  2.2  PHOS  --   --   2.0*   Liver Function Tests: Recent Labs  Lab 07/26/20 1135 07/27/20 0919  AST 59* 54*  ALT 38 43  ALKPHOS 46 49  BILITOT 0.5 0.3  PROT 7.2 7.1  ALBUMIN 3.8 3.5   No results for input(s): LIPASE, AMYLASE in the last 168 hours. No results for input(s): AMMONIA in the last 168 hours. CBC: Recent Labs  Lab 07/26/20 1135 07/26/20 1350 07/27/20 0919  WBC 4.0 4.4 5.9  NEUTROABS 3.4  --  5.0  HGB 15.8 15.2 16.5  HCT 47.7 46.4 50.5  MCV 88.5 89.2 89.5  PLT 166 166 180   Cardiac Enzymes: No results for input(s): CKTOTAL, CKMB, CKMBINDEX, TROPONINI in the last 168 hours. BNP: Invalid input(s): POCBNP CBG: No results for input(s): GLUCAP in the last 168 hours. D-Dimer Recent Labs    07/26/20 1135 07/27/20 0919  DDIMER 0.56* 0.35   Hgb A1c No results for input(s): HGBA1C in the last 72 hours. Lipid Profile Recent Labs    07/26/20 1135  TRIG 96   Thyroid function studies No results for input(s): TSH, T4TOTAL, T3FREE, THYROIDAB in the last 72 hours.  Invalid input(s): FREET3 Anemia work up Recent Labs    07/26/20 1135 07/27/20 0919  FERRITIN 391* 562*   Urinalysis    Component Value Date/Time   COLORURINE YELLOW 11/15/2009 1821  APPEARANCEUR CLEAR 11/15/2009 1821   LABSPEC 1.030 11/15/2009 1821   PHURINE 7.0 11/15/2009 1821   GLUCOSEU 100 (A) 11/15/2009 1821   BILIRUBINUR NEG 11/15/2009 1821   KETONESUR NEG mg/dL 11/15/2009 1821   PROTEINUR NEG mg/dL 11/15/2009 1821   UROBILINOGEN 0.2 11/15/2009 1821   NITRITE NEG 11/15/2009 1821   LEUKOCYTESUR NEG 11/15/2009 1821   Sepsis Labs Invalid input(s): PROCALCITONIN,  WBC,  LACTICIDVEN Microbiology Recent Results (from the past 240 hour(s))  Blood Culture (routine x 2)     Status: None (Preliminary result)   Collection Time: 07/26/20 11:35 AM   Specimen: BLOOD  Result Value Ref Range Status   Specimen Description BLOOD LEFT ANTECUBITAL  Final   Special Requests BOTTLES DRAWN AEROBIC AND ANAEROBIC   Final   Culture   Final    NO GROWTH < 24 HOURS Performed at [facility], [address] [place], [place] [address]    Report Status PENDING  Incomplete  Blood Culture (routine x 2)     Status: None (Preliminary result)   Collection Time: 07/26/20 12:30 PM   Specimen: BLOOD  Result Value Ref Range Status   Specimen Description BLOOD LEFT HAND  Final   Special Requests   Final    BOTTLES DRAWN AEROBIC AND ANAEROBIC Blood Culture adequate volume   Culture   Final    NO GROWTH < 24 HOURS Performed at [facility], [address], [place], [place] [address]    Report Status PENDING  Incomplete   Time coordinating discharge: 45 mins   SIGNED:  Irwin Brakeman, MD  [facility] 07/27/2020, 11:29 AM How to contact the [facility] Attending or Consulting provider [place] or covering provider during after hours [place], for this patient?  1. Check the care team in [facility] and look for a) attending/consulting [facility] provider listed and b) the [facility] team listed 2. Log into [email or web address] and use [place]'s universal password to access. If you do not have the password, please contact the hospital operator. 3. Locate the [facility] provider you are looking for under [facility] and page to a number that you can be directly reached. 4. If you still have difficulty reaching the provider, please page the [facility] (Director on Call) for the Hospitalists listed on amion for assistance.

## 2020-07-27 NOTE — Progress Notes (Signed)
Patient scheduled for outpatient Remdesivir infusions at 3:30pm on Friday 1/21, Saturday 1/22, and Monday 1/24 at [facility]. Please inform the patient to park at [place], as staff will be escorting the patient through the [facility] [place] entrance of the hospital. Appointments take approximately 45 minutes.    There is a wave flag banner located near the entrance on [address] Turn into this entrance and immediately turn left or right and park in 1 of the 10 designated Covid Infusion Parking spots. There is a phone number on the sign, please call and let the staff know what spot you are in and we will come out and get you. For questions call [phone].  Thanks.

## 2020-07-28 ENCOUNTER — Ambulatory Visit (HOSPITAL_COMMUNITY)
Admission: RE | Admit: 2020-07-28 | Discharge: 2020-07-28 | Disposition: A | Discharge status: Home / Self Care | Payer: BLUE CROSS/BLUE SHIELD | Source: Ambulatory Visit | Attending: Pulmonary Disease | Admitting: Pulmonary Disease

## 2020-07-28 DIAGNOSIS — J189 Pneumonia, unspecified organism: Secondary | ICD-10-CM | POA: Diagnosis not present

## 2020-07-28 DIAGNOSIS — J1282 Pneumonia due to coronavirus disease 2019: Secondary | ICD-10-CM | POA: Insufficient documentation

## 2020-07-28 DIAGNOSIS — I10 Essential (primary) hypertension: Secondary | ICD-10-CM | POA: Diagnosis not present

## 2020-07-28 DIAGNOSIS — F32A Depression, unspecified: Secondary | ICD-10-CM | POA: Diagnosis not present

## 2020-07-28 DIAGNOSIS — U071 COVID-19: Secondary | ICD-10-CM | POA: Insufficient documentation

## 2020-07-28 DIAGNOSIS — Z8249 Family history of ischemic heart disease and other diseases of the circulatory system: Secondary | ICD-10-CM | POA: Diagnosis not present

## 2020-07-28 DIAGNOSIS — J9601 Acute respiratory failure with hypoxia: Secondary | ICD-10-CM | POA: Diagnosis not present

## 2020-07-28 DIAGNOSIS — R739 Hyperglycemia, unspecified: Secondary | ICD-10-CM | POA: Diagnosis not present

## 2020-07-28 DIAGNOSIS — Z6832 Body mass index (BMI) 32.0-32.9, adult: Secondary | ICD-10-CM | POA: Diagnosis not present

## 2020-07-28 DIAGNOSIS — Z823 Family history of stroke: Secondary | ICD-10-CM | POA: Diagnosis not present

## 2020-07-28 DIAGNOSIS — E669 Obesity, unspecified: Secondary | ICD-10-CM | POA: Diagnosis not present

## 2020-07-28 DIAGNOSIS — Z7952 Long term (current) use of systemic steroids: Secondary | ICD-10-CM | POA: Diagnosis not present

## 2020-07-28 DIAGNOSIS — R7303 Prediabetes: Secondary | ICD-10-CM | POA: Diagnosis not present

## 2020-07-28 DIAGNOSIS — B20 Human immunodeficiency virus [HIV] disease: Secondary | ICD-10-CM | POA: Diagnosis not present

## 2020-07-28 DIAGNOSIS — F419 Anxiety disorder, unspecified: Secondary | ICD-10-CM | POA: Diagnosis not present

## 2020-07-28 DIAGNOSIS — Z79899 Other long term (current) drug therapy: Secondary | ICD-10-CM | POA: Diagnosis not present

## 2020-07-28 DIAGNOSIS — R0602 Shortness of breath: Secondary | ICD-10-CM | POA: Diagnosis not present

## 2020-07-28 DIAGNOSIS — R0902 Hypoxemia: Secondary | ICD-10-CM | POA: Diagnosis not present

## 2020-07-28 DIAGNOSIS — Z720 Tobacco use: Secondary | ICD-10-CM | POA: Diagnosis not present

## 2020-07-28 MED ORDER — EPINEPHRINE 0.3 MG/0.3ML IJ SOAJ: 0.3000 mg | Freq: Once | INTRAMUSCULAR | Status: DC | PRN

## 2020-07-28 MED ORDER — SODIUM CHLORIDE 0.9 % IV SOLN: INTRAVENOUS | Status: DC | PRN

## 2020-07-28 MED ORDER — METHYLPREDNISOLONE SODIUM SUCC 125 MG IJ SOLR: 125.0000 mg | Freq: Once | INTRAMUSCULAR | Status: DC | PRN

## 2020-07-28 MED ORDER — FAMOTIDINE IN NACL 20-0.9 MG/50ML-% IV SOLN: 20.0000 mg | Freq: Once | INTRAVENOUS | Status: DC | PRN

## 2020-07-28 MED ORDER — ALBUTEROL SULFATE HFA 108 (90 BASE) MCG/ACT IN AERS: 2.0000 | INHALATION_SPRAY | Freq: Once | RESPIRATORY_TRACT | Status: DC | PRN

## 2020-07-28 MED ORDER — SODIUM CHLORIDE 0.9 % IV SOLN
100.0000 mg | Freq: Once | INTRAVENOUS | Status: AC
  Administered 2020-07-28: 100 mg via INTRAVENOUS

## 2020-07-28 MED ORDER — DIPHENHYDRAMINE HCL 50 MG/ML IJ SOLN: 50.0000 mg | Freq: Once | INTRAMUSCULAR | Status: DC | PRN

## 2020-07-28 NOTE — Discharge Instructions (Signed)

## 2020-07-28 NOTE — Progress Notes (Signed)
Patient reviewed Fact Sheet for Patients, Parents, and Caregivers for Emergency Use Authorization (EUA) of Remdesivir for the Treatment of Coronavirus. Patient also reviewed and is agreeable to the estimated cost of treatment. Patient is agreeable to proceed.   

## 2020-07-28 NOTE — Progress Notes (Signed)
  Diagnosis: COVID-19  Physician: Dr Joya Gaskins  Procedure: Covid Infusion Clinic Med: remdesivir infusion - Provided patient with remdesivir fact sheet for patients, parents and caregivers prior to infusion.  Complications: No immediate complications noted.  Discharge: Discharged home   Thomas Armstrong 07/28/2020

## 2020-07-29 ENCOUNTER — Ambulatory Visit (HOSPITAL_COMMUNITY)
Admit: 2020-07-29 | Discharge: 2020-07-29 | Disposition: A | Discharge status: Home / Self Care | Payer: BLUE CROSS/BLUE SHIELD | Source: Ambulatory Visit | Attending: Pulmonary Disease | Admitting: Pulmonary Disease

## 2020-07-29 DIAGNOSIS — U071 COVID-19: Secondary | ICD-10-CM | POA: Insufficient documentation

## 2020-07-29 DIAGNOSIS — J1282 Pneumonia due to coronavirus disease 2019: Secondary | ICD-10-CM | POA: Insufficient documentation

## 2020-07-29 MED ORDER — DIPHENHYDRAMINE HCL 50 MG/ML IJ SOLN: 50.0000 mg | Freq: Once | INTRAMUSCULAR | Status: DC | PRN

## 2020-07-29 MED ORDER — SODIUM CHLORIDE 0.9 % IV SOLN: INTRAVENOUS | Status: DC | PRN

## 2020-07-29 MED ORDER — EPINEPHRINE 0.3 MG/0.3ML IJ SOAJ: 0.3000 mg | Freq: Once | INTRAMUSCULAR | Status: DC | PRN

## 2020-07-29 MED ORDER — SODIUM CHLORIDE 0.9 % IV SOLN
100.0000 mg | Freq: Once | INTRAVENOUS | Status: AC
  Administered 2020-07-29: 100 mg via INTRAVENOUS

## 2020-07-29 MED ORDER — METHYLPREDNISOLONE SODIUM SUCC 125 MG IJ SOLR: 125.0000 mg | Freq: Once | INTRAMUSCULAR | Status: DC | PRN

## 2020-07-29 MED ORDER — FAMOTIDINE IN NACL 20-0.9 MG/50ML-% IV SOLN: 20.0000 mg | Freq: Once | INTRAVENOUS | Status: DC | PRN

## 2020-07-29 MED ORDER — ALBUTEROL SULFATE HFA 108 (90 BASE) MCG/ACT IN AERS: 2.0000 | INHALATION_SPRAY | Freq: Once | RESPIRATORY_TRACT | Status: DC | PRN

## 2020-07-29 NOTE — Progress Notes (Signed)
  Diagnosis: COVID-19  Physician: Dr. Patrick Wright  Procedure: Covid Infusion Clinic Med: remdesivir infusion - Provided patient with remdesivir fact sheet for patients, parents and caregivers prior to infusion.  Complications: No immediate complications noted.  Discharge: Discharged home   Thomas Armstrong 07/29/2020  

## 2020-07-29 NOTE — Discharge Instructions (Signed)

## 2020-07-30 ENCOUNTER — Inpatient Hospital Stay (HOSPITAL_COMMUNITY)
Admission: EM | Admit: 2020-07-30 | Discharge: 2020-08-03 | Disposition: A | Discharge status: Home / Self Care | Payer: BLUE CROSS/BLUE SHIELD | Source: Home / Self Care | Attending: Internal Medicine | Admitting: Internal Medicine

## 2020-07-30 ENCOUNTER — Other Ambulatory Visit: Payer: Self-pay

## 2020-07-30 ENCOUNTER — Encounter (HOSPITAL_COMMUNITY): Payer: Self-pay

## 2020-07-30 ENCOUNTER — Emergency Department (HOSPITAL_COMMUNITY): Payer: BLUE CROSS/BLUE SHIELD

## 2020-07-30 DIAGNOSIS — J9600 Acute respiratory failure, unspecified whether with hypoxia or hypercapnia: Secondary | ICD-10-CM | POA: Diagnosis present

## 2020-07-30 DIAGNOSIS — Z8249 Family history of ischemic heart disease and other diseases of the circulatory system: Secondary | ICD-10-CM

## 2020-07-30 DIAGNOSIS — I10 Essential (primary) hypertension: Secondary | ICD-10-CM | POA: Diagnosis present

## 2020-07-30 DIAGNOSIS — Z79899 Other long term (current) drug therapy: Secondary | ICD-10-CM

## 2020-07-30 DIAGNOSIS — J1282 Pneumonia due to coronavirus disease 2019: Secondary | ICD-10-CM | POA: Diagnosis present

## 2020-07-30 DIAGNOSIS — F32A Depression, unspecified: Secondary | ICD-10-CM | POA: Diagnosis present

## 2020-07-30 DIAGNOSIS — Z6832 Body mass index (BMI) 32.0-32.9, adult: Secondary | ICD-10-CM

## 2020-07-30 DIAGNOSIS — Z87891 Personal history of nicotine dependence: Secondary | ICD-10-CM | POA: Diagnosis not present

## 2020-07-30 DIAGNOSIS — R0902 Hypoxemia: Secondary | ICD-10-CM | POA: Diagnosis present

## 2020-07-30 DIAGNOSIS — R7303 Prediabetes: Secondary | ICD-10-CM | POA: Diagnosis present

## 2020-07-30 DIAGNOSIS — Z720 Tobacco use: Secondary | ICD-10-CM

## 2020-07-30 DIAGNOSIS — U071 COVID-19: Secondary | ICD-10-CM | POA: Diagnosis present

## 2020-07-30 DIAGNOSIS — J9601 Acute respiratory failure with hypoxia: Secondary | ICD-10-CM | POA: Diagnosis present

## 2020-07-30 DIAGNOSIS — R739 Hyperglycemia, unspecified: Secondary | ICD-10-CM | POA: Diagnosis present

## 2020-07-30 DIAGNOSIS — R630 Anorexia: Secondary | ICD-10-CM | POA: Diagnosis present

## 2020-07-30 DIAGNOSIS — Z823 Family history of stroke: Secondary | ICD-10-CM

## 2020-07-30 DIAGNOSIS — Z7952 Long term (current) use of systemic steroids: Secondary | ICD-10-CM

## 2020-07-30 DIAGNOSIS — F419 Anxiety disorder, unspecified: Secondary | ICD-10-CM | POA: Diagnosis present

## 2020-07-30 DIAGNOSIS — B20 Human immunodeficiency virus [HIV] disease: Secondary | ICD-10-CM | POA: Diagnosis present

## 2020-07-30 DIAGNOSIS — E669 Obesity, unspecified: Secondary | ICD-10-CM | POA: Diagnosis present

## 2020-07-30 LAB — TROPONIN I (HIGH SENSITIVITY): Troponin I (High Sensitivity): 6 ng/L (ref <18)

## 2020-07-30 LAB — BASIC METABOLIC PANEL
Anion gap: 10 (ref 5–15)
BUN: 19 mg/dL (ref 6–20)
CO2: 28 mmol/L (ref 22–32)
Calcium: 9 mg/dL (ref 8.9–10.3)
Chloride: 98 mmol/L (ref 98–111)
Creatinine, Ser: 1.1 mg/dL (ref 0.61–1.24)
GFR, Estimated: >60 mL/min (ref >60)
Glucose, Bld: 115 mg/dL — ABNORMAL HIGH (ref 70–99)
Potassium: 3.9 mmol/L (ref 3.5–5.1)
Sodium: 136 mmol/L (ref 135–145)

## 2020-07-30 LAB — FIBRINOGEN: Fibrinogen: 722 mg/dL — ABNORMAL HIGH (ref 210–475)

## 2020-07-30 LAB — CBC WITH DIFFERENTIAL/PLATELET
Abs Immature Granulocytes: 0.06 10*3/uL (ref 0.00–0.07)
Basophils Absolute: 0 10*3/uL (ref 0.0–0.1)
Basophils Relative: 0 %
Eosinophils Absolute: 0 10*3/uL (ref 0.0–0.5)
Eosinophils Relative: 0 %
HCT: 48.8 % (ref 39.0–52.0)
Hemoglobin: 16 g/dL (ref 13.0–17.0)
Immature Granulocytes: 1 %
Lymphocytes Relative: 5 %
Lymphs Abs: 0.4 10*3/uL — ABNORMAL LOW (ref 0.7–4.0)
MCH: 29.1 pg (ref 26.0–34.0)
MCHC: 32.8 g/dL (ref 30.0–36.0)
MCV: 88.9 fL (ref 80.0–100.0)
Monocytes Absolute: 0.2 10*3/uL (ref 0.1–1.0)
Monocytes Relative: 3 %
Neutro Abs: 7.8 10*3/uL — ABNORMAL HIGH (ref 1.7–7.7)
Neutrophils Relative %: 91 %
Platelets: 293 10*3/uL (ref 150–400)
RBC: 5.49 MIL/uL (ref 4.22–5.81)
RDW: 14.5 % (ref 11.5–15.5)
WBC: 8.6 10*3/uL (ref 4.0–10.5)
nRBC: 0 % (ref 0.0–0.2)

## 2020-07-30 LAB — C-REACTIVE PROTEIN: CRP: 10.6 mg/dL — ABNORMAL HIGH (ref <1.0)

## 2020-07-30 LAB — D-DIMER, QUANTITATIVE: D-Dimer, Quant: 0.46 ug/mL-FEU (ref 0.00–0.50)

## 2020-07-30 LAB — BLOOD GAS, ARTERIAL
Acid-Base Excess: 4.3 mmol/L — ABNORMAL HIGH (ref 0.0–2.0)
Bicarbonate: 28.6 mmol/L — ABNORMAL HIGH (ref 20.0–28.0)
FIO2: 72
O2 Saturation: 96.1 %
Patient temperature: 37.2
pCO2 arterial: 37.1 mmHg (ref 32.0–48.0)
pH, Arterial: 7.487 — ABNORMAL HIGH (ref 7.350–7.450)
pO2, Arterial: 80.9 mmHg — ABNORMAL LOW (ref 83.0–108.0)

## 2020-07-30 LAB — FERRITIN: Ferritin: 625 ng/mL — ABNORMAL HIGH (ref 24–336)

## 2020-07-30 LAB — PROCALCITONIN: Procalcitonin: <0.1 ng/mL

## 2020-07-30 LAB — LACTATE DEHYDROGENASE: LDH: 334 U/L — ABNORMAL HIGH (ref 98–192)

## 2020-07-30 LAB — MAGNESIUM: Magnesium: 2.3 mg/dL (ref 1.7–2.4)

## 2020-07-30 LAB — TRIGLYCERIDES: Triglycerides: 71 mg/dL (ref <150)

## 2020-07-30 MED ORDER — IOHEXOL 350 MG/ML SOLN
80.0000 mL | Freq: Once | INTRAVENOUS | Status: AC | PRN
  Administered 2020-07-30: 80 mL via INTRAVENOUS

## 2020-07-30 MED ORDER — METHYLPREDNISOLONE SODIUM SUCC 125 MG IJ SOLR
125.0000 mg | Freq: Once | INTRAMUSCULAR | Status: AC
  Administered 2020-07-31: 125 mg via INTRAVENOUS
  Filled 2020-07-30: qty 2

## 2020-07-30 NOTE — H&P (Addendum)
History and Physical    Thomas Armstrong HKV:[id number] DOB: August 28, 1970 DOA: 07/30/2020  PCP: [name], MD   Patient coming from: Home  I have personally briefly reviewed patient's old medical records in [place]  Chief Complaint: Difficulty breathing  HPI: Thomas Armstrong is a 50 y.o. male with medical history significant for HIV and tobacco abuse. Patient presented to the ED with complaints of increasing difficulty breathing since was discharged from the hospital 3 days ago.  He reports he was discharged on 3 L, and he has been increasing his O2, now up to 6 to 8 L.  He denies chest pain.  He reports compliance with his medications prednisone, and remdesivir.  He has 1 more dose of remdesivir to receive as an outpatient. Due to increasing O2 requirements, patient and his wife are concerned, so patient came to the ED.    ED Course: Temperature 99.2.  Heart rate 80s to 100.  Blood pressure systolic 564P -329J.  O2 sats 83% on 6 L, placed on high flow nasal cannula 13 L, sats 91 to 95%..  Unremarkable CBC BMP.  D-dimer 0.46.  CTA chest negative for PE, shows progression of bilateral multifocal COVID-pneumonia. Hospitalist to admit.  Review of Systems: As per HPI all other systems reviewed and negative.  Past Medical History:  Diagnosis Date  . Anxiety   . Depression   . HIV infection ([place])   . HTN, white coat 02/21/2016  . Hypertension   . Hypertension 04/30/2017    History reviewed. No pertinent surgical history.   reports that he has quit smoking. He has never used smokeless tobacco. He reports current alcohol use. He reports that he does not use drugs.  Allergies  Allergen Reactions  . Codeine     REACTION: makes pt very sleepy    Family History  Problem Relation Age of Onset  . Stroke Mother   . Stroke Father   . Heart disease Paternal Grandmother   . Stroke Paternal Grandmother      Prior to Admission medications   Medication Sig Start Date End Date  Taking? Authorizing Provider  acetaminophen (TYLENOL) 500 MG tablet Take 1,000 mg by mouth every 6 (six) hours as needed.   Yes [provider]  albuterol (VENTOLIN HFA) 108 (90 Base) MCG/ACT inhaler Inhale 2 puffs into the lungs every 4 (four) hours as needed for wheezing or shortness of breath. 07/27/20  Yes Johnson, Clanford L, MD  ascorbic acid (VITAMIN C) 500 MG tablet Take 1 tablet (500 mg total) by mouth daily. 07/28/20  Yes Johnson, Clanford L, MD  bictegravir-emtricitabine-tenofovir AF (BIKTARVY) 50-200-25 MG TABS tablet Take 1 tablet by mouth daily. 06/29/20  Yes Tommy Medal, Lavell Islam, MD  cetirizine (ZYRTEC) 10 MG tablet Take 10 mg by mouth daily.   Yes [provider]  Cholecalciferol (VITAMIN D3) 50 MCG (2000 UT) TABS Take 1 tablet by mouth daily.   Yes [provider]  guaiFENesin-dextromethorphan (ROBITUSSIN DM) 100-10 MG/5ML syrup Take 10 mLs by mouth every 4 (four) hours as needed for cough. 07/27/20  Yes Johnson, Clanford L, MD  predniSONE (DELTASONE) 50 MG tablet Take 1 tablet (50 mg total) by mouth daily with breakfast for 8 days. 07/28/20 08/05/20 Yes Johnson, Clanford L, MD  zinc sulfate 220 (50 Zn) MG capsule Take 1 capsule (220 mg total) by mouth daily. 07/28/20  Yes Murlean Iba, MD    Physical Exam: Vitals:   07/30/20 1800 07/30/20 1830 07/30/20 1907  07/30/20 1930  BP:   (!) 141/92 134/88  Pulse: 91 80 96 83  Resp: 16 16 15  (!) 26  Temp:      TempSrc:      SpO2: 92% 94% 91% 94%  Weight:      Height:        Constitutional: Mild increased work of breathing,  Vitals:   07/30/20 1800 07/30/20 1830 07/30/20 1907 07/30/20 1930  BP:   (!) 141/92 134/88  Pulse: 91 80 96 83  Resp: 16 16 15  (!) 26  Temp:      TempSrc:      SpO2: 92% 94% 91% 94%  Weight:      Height:       Eyes: PERRL, lids and conjunctivae normal ENMT: Mucous membranes are moist. Posterior pharynx clear of any exudate or lesions.Normal dentition.  Neck: normal, supple,  no masses, no thyromegaly Respiratory: Mild increased work of breathing even with long sentences, otherwise appears comfortable, no accessory muscle use.  Cardiovascular: Regular rate and rhythm,  No extremity edema. 2+ pedal pulses.   Abdomen: no tenderness, no masses palpated. No hepatosplenomegaly. Bowel sounds positive.  Musculoskeletal: no clubbing / cyanosis. No joint deformity upper and lower extremities. Good ROM, no contractures. Normal muscle tone.  Skin: no rashes, lesions, ulcers. No induration Neurologic: No apparent clinic abnormality, moving extremities spontaneously. Psychiatric: Normal judgment and insight. Alert and oriented x 3. Normal mood.   Labs on Admission: I have personally reviewed following labs and imaging studies  CBC: Recent Labs  Lab 07/26/20 1135 07/26/20 1350 07/27/20 0919 07/30/20 1730  WBC 4.0 4.4 5.9 8.6  NEUTROABS 3.4  --  5.0 7.8*  HGB 15.8 15.2 16.5 16.0  HCT 47.7 46.4 50.5 48.8  MCV 88.5 89.2 89.5 88.9  PLT 166 166 180 293   Basic Metabolic Panel: Recent Labs  Lab 07/26/20 1135 07/26/20 1350 07/27/20 0919 07/30/20 1730  NA 134*  --  137 136  K 3.3*  --  3.3* 3.9  CL 95*  --  101 98  CO2 26  --  26 28  GLUCOSE 107*  --  138* 115*  BUN 16  --  21* 19  CREATININE 1.28* 1.38* 1.08 1.10  CALCIUM 8.6*  --  8.9 9.0  MG  --   --  2.2 2.3  PHOS  --   --  2.0*  --    GFR: Estimated Creatinine Clearance: 112.3 mL/min (by C-G formula based on SCr of 1.1 mg/dL). Liver Function Tests: Recent Labs  Lab 07/26/20 1135 07/27/20 0919  AST 59* 54*  ALT 38 43  ALKPHOS 46 49  BILITOT 0.5 0.3  PROT 7.2 7.1  ALBUMIN 3.8 3.5    Radiological Exams on Admission: CT Angio Chest PE W and/or Wo Contrast  Result Date: 07/30/2020 CLINICAL DATA:  COVID-19 diagnosed 2 weeks ago, chest pain, hypoxia, short of breath EXAM: CT ANGIOGRAPHY CHEST WITH CONTRAST TECHNIQUE: Multidetector CT imaging of the chest was performed using the standard protocol  during bolus administration of intravenous contrast. Multiplanar CT image reconstructions and MIPs were obtained to evaluate the vascular anatomy. CONTRAST:  77mL OMNIPAQUE IOHEXOL 350 MG/ML SOLN COMPARISON:  07/26/2020 FINDINGS: Cardiovascular: This is a technically adequate evaluation of the pulmonary vasculature. No filling defects or pulmonary emboli. The heart is unremarkable without pericardial effusion. No evidence of thoracic aortic aneurysm or dissection. Mediastinum/Nodes: No enlarged mediastinal, hilar, or axillary lymph nodes. Thyroid gland, trachea, and esophagus demonstrate no significant findings. Lungs/Pleura: There  is multifocal bilateral airspace disease, more prominent than previous chest x-ray. No effusion or pneumothorax. Central airways are widely patent. Upper Abdomen: No acute abnormality. 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 Musculoskeletal: No acute or destructive bony lesions. Reconstructed images demonstrate no additional findings. Review of the MIP images confirms the above findings. IMPRESSION: 1. No evidence of pulmonary embolus. 2. Progressive multifocal bilateral COVID-19 pneumonia. 3. 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 Electronically Signed   By: Randa Ngo M.D.   On: 07/30/2020 19:18    EKG: Independently reviewed.  Sinus rhythm Rate 86.  QTc 441.  No significant change from prior.  Assessment/Plan Principal Problem:   Acute respiratory failure due to COVID-19 [facility]) Active Problems:   Human immunodeficiency virus (HIV) disease (H[facility])   TOBACCO ABUSE, HX OF   Hypertension   Acute respiratory failure due to COVID-19 pneumonia-O2 sats 83% on 6 L, currently on 13 L high flow nasal cannula.  Recent hospitalization and discharge on 3 L, treated with remdesivir dexamethasone.  Patient is unvaccinated. - CTA chest negative for pulmonary embolism, but shows progression of multifocal  bilateral COVID-pneumonia.  - 125mg  IV Solu-Medrol x1, continue weight-based dosing at 0.5 mg/kg -Unfortunately due to chronic immunosuppression, patient is not a candidate for Actemra. -Obtain and trend inflammatory markers -CMP, CBC daily -Incentive spirometer, flutter valve, inhalers As needed -multivitamins, supplemental O2  HIV infection-well-controlled.  As of 06/13/2020 - CD4 count 766, viral load undetectable. -Resume HIV meds.  Hypertension-systolic 676P to 950D.  Not on medication.  Tobacco abuse   DVT prophylaxis: Lovenox Code Status: Full code. Family Communication: None at bedside. Disposition Plan:  ~/> 2 days Consults called: None. Admission status: Ipt, Step down I certify that at the point of admission it is my clinical judgment that the patient will require inpatient hospital care spanning beyond 2 midnights from the point of admission due to high intensity of service, high risk for further deterioration and high frequency of surveillance required.   Bethena Roys MD [facility]  07/30/2020, 10:16 PM

## 2020-07-30 NOTE — ED Notes (Addendum)
Pt 83% on 6L [place]. Respiratory placed pt on HFNC/salter bottle on 13L

## 2020-07-30 NOTE — ED Provider Notes (Signed)
[facility] EMERGENCY DEPARTMENT Provider Note   CSN: [place]:[id number] Arrival date & time: 07/30/20  1327     History Chief Complaint  Patient presents with  . Shortness of Breath    Thomas Armstrong is a 50 y.o. male w/ hx of HIV, well controlled on antivirals, HTN, recent hospitalization for COVID hypoxic respiratory failure, present emergency department with worsening shortness of breath and hypoxia.  Patient was hospitalized from 1/19-1/20/22 for COVID hypoxia.  He had tested positive approx 13 days ago as an outpatient, and had symptoms beginning " few days before that."  He has not had any covid vaccines.  Per medical record review, at the time of discharge from the hospital 3 days ago, the patient was maintaining his O2 sats on 3 L nasal cannula.  However the patient tells me over the past few days at home, his O2 requirements have increased to 6 to 8 L.  He continues have the same level of dyspnea.  Worse on exertion.  He denies ever having any significant cough.  He reports continued poor appetite and nausea, which is not changed from prior admission.  He and his wife, who is a Marine scientist, were both concerned about his increasing oxygen requirements.  He reports he has had 2 rounds of remdesivir and is due for third.  Per his medical records, he also received IV Decadron while in the hospital.  He was discharged on 8 more days of oral prednisone to complete a 10-day course.  He has been taking these.  He denies any prior history of PE.  He does report having some chest pressure which is been constant throughout the COVID.  He denies any history of MI.  HPI     Past Medical History:  Diagnosis Date  . Anxiety   . Depression   . HIV infection ([place])   . HTN, white coat 02/21/2016  . Hypertension   . Hypertension 04/30/2017    Patient Active Problem List   Diagnosis Date Noted  . Acute respiratory failure due to COVID-19 ([place]) 07/26/2020  . Hypertension 04/30/2017  . HTN, white coat  02/21/2016  . OTHER DISEASES OF NASAL CAVITY AND SINUSES 11/30/2009  . WEIGHT GAIN 11/30/2009  . TOBACCO ABUSE, HX OF 11/30/2009  . ALLERGIC RHINITIS, SEASONAL 09/28/2007  . Human immunodeficiency virus (HIV) disease ([place]) 08/18/2006    History reviewed. No pertinent surgical history.     Family History  Problem Relation Age of Onset  . Stroke Mother   . Stroke Father   . Heart disease Paternal Grandmother   . Stroke Paternal Grandmother     Social History   Tobacco Use  . Smoking status: Former Research scientist (life sciences)  . Smokeless tobacco: Never Used  Substance Use Topics  . Alcohol use: Yes    Alcohol/week: 0.0 standard drinks    Comment: rarely  . Drug use: No    Home Medications Prior to Admission medications   Medication Sig Start Date End Date Taking? Authorizing Provider  acetaminophen (TYLENOL) 500 MG tablet Take 1,000 mg by mouth every 6 (six) hours as needed.   Yes [provider]  albuterol (VENTOLIN HFA) 108 (90 Base) MCG/ACT inhaler Inhale 2 puffs into the lungs every 4 (four) hours as needed for wheezing or shortness of breath. 07/27/20  Yes Johnson, Clanford L, MD  ascorbic acid (VITAMIN C) 500 MG tablet Take 1 tablet (500 mg total) by mouth daily. 07/28/20  Yes Johnson, Clanford L, MD  bictegravir-emtricitabine-tenofovir AF ([place]) [phone]  MG TABS tablet Take 1 tablet by mouth daily. 06/29/20  Yes Tommy Medal, Lavell Islam, MD  cetirizine (ZYRTEC) 10 MG tablet Take 10 mg by mouth daily.   Yes [provider]  Cholecalciferol (VITAMIN D3) 50 MCG (2000 UT) TABS Take 1 tablet by mouth daily.   Yes [provider]  guaiFENesin-dextromethorphan (ROBITUSSIN DM) 100-10 MG/5ML syrup Take 10 mLs by mouth every 4 (four) hours as needed for cough. 07/27/20  Yes Johnson, Clanford L, MD  predniSONE (DELTASONE) 50 MG tablet Take 1 tablet (50 mg total) by mouth daily with breakfast for 8 days. 07/28/20 08/05/20 Yes Johnson, Clanford L, MD  zinc sulfate 220 (50 Zn) MG  capsule Take 1 capsule (220 mg total) by mouth daily. 07/28/20  Yes Johnson, Clanford L, MD    Allergies    Codeine  Review of Systems   Review of Systems  Constitutional: Positive for appetite change and fatigue. Negative for fever.  HENT: Negative for ear pain and sore throat.   Eyes: Negative for pain and visual disturbance.  Respiratory: Positive for cough and shortness of breath.   Cardiovascular: Positive for chest pain. Negative for palpitations.  Gastrointestinal: Positive for nausea. Negative for abdominal pain and vomiting.  Genitourinary: Negative for dysuria and hematuria.  Musculoskeletal: Positive for arthralgias and myalgias.  Skin: Negative for color change and rash.  Neurological: Negative for syncope and headaches.  All other systems reviewed and are negative.   Physical Exam Updated Vital Signs BP 134/88   Pulse 83   Temp 99 F (37.2 C) (Oral)   Resp (!) 26   Ht 6\' 2"  (1.88 m)   Wt 115.7 kg   SpO2 94%   BMI 32.74 kg/m   Physical Exam Constitutional:      General: He is not in acute distress. HENT:     Head: Normocephalic and atraumatic.  Eyes:     Conjunctiva/sclera: Conjunctivae normal.     Pupils: Pupils are equal, round, and reactive to light.  Cardiovascular:     Rate and Rhythm: Normal rate and regular rhythm.  Pulmonary:     Effort: Pulmonary effort is normal. No respiratory distress.     Comments: 85% on 3L [employer], improved to 95% on 10L  High Flow Crackles in bilateral lung exam RR normal, speaking in full sentences Abdominal:     General: There is no distension.     Tenderness: There is no abdominal tenderness.  Skin:    General: Skin is warm and dry.  Neurological:     General: No focal deficit present.     Mental Status: He is alert. Mental status is at baseline.  Psychiatric:        Mood and Affect: Mood normal.        Behavior: Behavior normal.     ED Results / Procedures / Treatments   Labs (all labs ordered are listed,  but only abnormal results are displayed) Labs Reviewed  BLOOD GAS, ARTERIAL - Abnormal; Notable for the following components:      Result Value   pH, Arterial 7.487 (*)    pO2, Arterial 80.9 (*)    Bicarbonate 28.6 (*)    Acid-Base Excess 4.3 (*)    All other components within normal limits  BASIC METABOLIC PANEL - Abnormal; Notable for the following components:   Glucose, Bld 115 (*)    All other components within normal limits  CBC WITH DIFFERENTIAL/PLATELET - Abnormal; Notable for the following components:   Neutro Abs  7.8 (*)    Lymphs Abs 0.4 (*)    All other components within normal limits  FIBRINOGEN - Abnormal; Notable for the following components:   Fibrinogen 722 (*)    All other components within normal limits  D-DIMER, QUANTITATIVE (NOT AT [facility])  MAGNESIUM  LACTATE DEHYDROGENASE  PROCALCITONIN  C-REACTIVE PROTEIN  FERRITIN  TRIGLYCERIDES  TROPONIN I (HIGH SENSITIVITY)    EKG EKG Interpretation  Date/Time:  Sunday July 30 2020 16:01:14 EST Ventricular Rate:  86 PR Interval:    QRS Duration: 96 QT Interval:  368 QTC Calculation: 441 R Axis:   41 Text Interpretation: Sinus rhythm No STEMI Confirmed by Octaviano Glow [id number]) on 07/30/2020 5:04:02 PM   Radiology CT Angio Chest PE W and/or Wo Contrast  Result Date: 07/30/2020 CLINICAL DATA:  COVID-19 diagnosed 2 weeks ago, chest pain, hypoxia, short of breath EXAM: CT ANGIOGRAPHY CHEST WITH CONTRAST TECHNIQUE: Multidetector CT imaging of the chest was performed using the standard protocol during bolus administration of intravenous contrast. Multiplanar CT image reconstructions and MIPs were obtained to evaluate the vascular anatomy. CONTRAST:  84mL OMNIPAQUE IOHEXOL 350 MG/ML SOLN COMPARISON:  07/26/2020 FINDINGS: Cardiovascular: This is a technically adequate evaluation of the pulmonary vasculature. No filling defects or pulmonary emboli. The heart is unremarkable without pericardial effusion. No evidence of  thoracic aortic aneurysm or dissection. Mediastinum/Nodes: No enlarged mediastinal, hilar, or axillary lymph nodes. Thyroid gland, trachea, and esophagus demonstrate no significant findings. Lungs/Pleura: There is multifocal bilateral airspace disease, more prominent than previous chest x-ray. No effusion or pneumothorax. Central airways are widely patent. Upper Abdomen: No acute abnormality. 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 Musculoskeletal: No acute or destructive bony lesions. Reconstructed images demonstrate no additional findings. Review of the MIP images confirms the above findings. IMPRESSION: 1. No evidence of pulmonary embolus. 2. Progressive multifocal bilateral COVID-19 pneumonia. 3. 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 Electronically Signed   By: Randa Ngo M.D.   On: 07/30/2020 19:18    Procedures .Critical Care Performed by: Wyvonnia Dusky, MD Authorized by: Wyvonnia Dusky, MD   Critical care provider statement:    Critical care time (minutes):  45   Critical care was necessary to treat or prevent imminent or life-threatening deterioration of the following conditions:  Respiratory failure   Critical care was time spent personally by me on the following activities:  Discussions with consultants, evaluation of patient's response to treatment, examination of patient, ordering and performing treatments and interventions, ordering and review of laboratory studies, ordering and review of radiographic studies, pulse oximetry, re-evaluation of patient's condition, obtaining history from patient or surrogate and review of old charts Comments:     Covid hypoxia   (including critical care time)  Medications Ordered in ED Medications  methylPREDNISolone sodium succinate (SOLU-MEDROL) 125 mg/2 mL injection 125 mg (has no administration in time range)  iohexol (OMNIPAQUE) 350 MG/ML injection 80  mL (80 mLs Intravenous Contrast Given 07/30/20 1904)    ED Course  I have reviewed the triage vital signs and the nursing notes.  Pertinent labs & imaging results that were available during my care of the patient were reviewed by me and considered in my medical decision making (see chart for details).  50 year old here with worsening hypoxia in the setting of recent hospitalization for COVID.  Here is requiring about 10 L nasal cannula, or high flow, to maintain oxygen saturation above 90%.  He otherwise is  not tachypneic and is breathing comfortably on the bed, although he does desat with movement.  This may be worsening pulmonary fibrosis or ARDS from the COVID illness, however cannot exclude the possibility of new bacterial infection and pulmonary embolism.  We will plan to obtain labs, an ABG, as well as a CT pulmonary embolism study if possible.  I have a lower suspicion for acute coronary syndrome.  I personally ordered and reviewed his labs showing ABG with pO2 81, pH 7.5, BMP unremarkable, CBC unremarkable, trop 6.  Ddimer 46.  Mg normal.   I reviewed prior medical records as noted above I personally ordered and reviewed his CT scan showing worsening covid pneumonia pattern His ECG shows sinus rhythm per my interpretation with no acute ischemia  High flow supplemental oxygen provided for hypoxia.   TORREZ RENFROE was evaluated in Emergency Department on 07/30/2020 for the symptoms described in the history of present illness. He was evaluated in the context of the global COVID-19 pandemic, which necessitated consideration that the patient might be at risk for infection with the SARS-CoV-2 virus that causes COVID-19. Institutional protocols and algorithms that pertain to the evaluation of patients at risk for COVID-19 are in a state of rapid change based on information released by regulatory bodies including the CDC and federal and state organizations. These policies and algorithms were  followed during the patient's care in the ED.   Clinical Course as of 07/30/20 2249  Nancy Fetter Jul 30, 2020  1938 Still on 10L HF.  90-93% at rest.  Will admit for worsening hypoxia - per CT suspect this is continuing covid hypoxia/fibrosis.  Doubt bacterial PNA.  pO2 low at 80 - perhaps ARDS component.  He is agreeable with hospitalization [MT]  2002 Signed out to hospitalist [MT]    Clinical Course User Index [MT] Merlinda Wrubel, Carola Rhine, MD    Final Clinical Impression(s) / ED Diagnoses Final diagnoses:  Hypoxia  COVID-19    Rx / DC Orders ED Discharge Orders    None       Wyvonnia Dusky, MD 07/30/20 2250

## 2020-07-30 NOTE — ED Triage Notes (Signed)
Pt to er, pt states that he was here a few days ago and dx with covid, states that he was sent home and put on home O2, states that he was going to be on 3L, but he was still feeling sob, so he turned his O2 up to 4, pt satting 84%, increased pt to 6L via [place], pt now satting 93%, states that he has been getting the infusions and only has one left, pt reports cough, fatigue and shortness of breath.

## 2020-07-31 ENCOUNTER — Ambulatory Visit (HOSPITAL_COMMUNITY): Payer: BLUE CROSS/BLUE SHIELD

## 2020-07-31 ENCOUNTER — Encounter (HOSPITAL_COMMUNITY): Payer: Self-pay | Admitting: Internal Medicine

## 2020-07-31 DIAGNOSIS — R739 Hyperglycemia, unspecified: Secondary | ICD-10-CM

## 2020-07-31 DIAGNOSIS — Z87891 Personal history of nicotine dependence: Secondary | ICD-10-CM

## 2020-07-31 DIAGNOSIS — E669 Obesity, unspecified: Secondary | ICD-10-CM

## 2020-07-31 LAB — CBC WITH DIFFERENTIAL/PLATELET
Abs Immature Granulocytes: 0.04 10*3/uL (ref 0.00–0.07)
Basophils Absolute: 0 10*3/uL (ref 0.0–0.1)
Basophils Relative: 0 %
Eosinophils Absolute: 0 10*3/uL (ref 0.0–0.5)
Eosinophils Relative: 0 %
HCT: 47.2 % (ref 39.0–52.0)
Hemoglobin: 15.4 g/dL (ref 13.0–17.0)
Immature Granulocytes: 1 %
Lymphocytes Relative: 3 %
Lymphs Abs: 0.3 10*3/uL — ABNORMAL LOW (ref 0.7–4.0)
MCH: 29.2 pg (ref 26.0–34.0)
MCHC: 32.6 g/dL (ref 30.0–36.0)
MCV: 89.4 fL (ref 80.0–100.0)
Monocytes Absolute: 0.1 10*3/uL (ref 0.1–1.0)
Monocytes Relative: 1 %
Neutro Abs: 8.1 10*3/uL — ABNORMAL HIGH (ref 1.7–7.7)
Neutrophils Relative %: 95 %
Platelets: 312 10*3/uL (ref 150–400)
RBC: 5.28 MIL/uL (ref 4.22–5.81)
RDW: 14.6 % (ref 11.5–15.5)
WBC: 8.4 10*3/uL (ref 4.0–10.5)
nRBC: 0 % (ref 0.0–0.2)

## 2020-07-31 LAB — COMPREHENSIVE METABOLIC PANEL
ALT: 74 U/L — ABNORMAL HIGH (ref 0–44)
AST: 42 U/L — ABNORMAL HIGH (ref 15–41)
Albumin: 3.1 g/dL — ABNORMAL LOW (ref 3.5–5.0)
Alkaline Phosphatase: 49 U/L (ref 38–126)
Anion gap: 13 (ref 5–15)
BUN: 22 mg/dL — ABNORMAL HIGH (ref 6–20)
CO2: 25 mmol/L (ref 22–32)
Calcium: 8.7 mg/dL — ABNORMAL LOW (ref 8.9–10.3)
Chloride: 99 mmol/L (ref 98–111)
Creatinine, Ser: 0.98 mg/dL (ref 0.61–1.24)
GFR, Estimated: >60 mL/min (ref >60)
Glucose, Bld: 128 mg/dL — ABNORMAL HIGH (ref 70–99)
Potassium: 4.1 mmol/L (ref 3.5–5.1)
Sodium: 137 mmol/L (ref 135–145)
Total Bilirubin: 0.6 mg/dL (ref 0.3–1.2)
Total Protein: 6.6 g/dL (ref 6.5–8.1)

## 2020-07-31 LAB — CULTURE, BLOOD (ROUTINE X 2)
Culture: NO GROWTH
Culture: NO GROWTH
Special Requests: ADEQUATE

## 2020-07-31 LAB — C-REACTIVE PROTEIN: CRP: 9.8 mg/dL — ABNORMAL HIGH (ref <1.0)

## 2020-07-31 LAB — FERRITIN: Ferritin: 670 ng/mL — ABNORMAL HIGH (ref 24–336)

## 2020-07-31 LAB — PHOSPHORUS: Phosphorus: 3.3 mg/dL (ref 2.5–4.6)

## 2020-07-31 LAB — MRSA PCR SCREENING: MRSA by PCR: NEGATIVE

## 2020-07-31 LAB — MAGNESIUM: Magnesium: 2.4 mg/dL (ref 1.7–2.4)

## 2020-07-31 LAB — D-DIMER, QUANTITATIVE: D-Dimer, Quant: 0.52 ug/mL-FEU — ABNORMAL HIGH (ref 0.00–0.50)

## 2020-07-31 MED ORDER — ONDANSETRON HCL 4 MG/2ML IJ SOLN: 4.0000 mg | Freq: Four times a day (QID) | INTRAMUSCULAR | Status: DC | PRN

## 2020-07-31 MED ORDER — ONDANSETRON HCL 4 MG PO TABS: 4.0000 mg | ORAL_TABLET | Freq: Four times a day (QID) | ORAL | Status: DC | PRN

## 2020-07-31 MED ORDER — POLYETHYLENE GLYCOL 3350 17 G PO PACK: 17.0000 g | PACK | Freq: Every day | ORAL | Status: DC | PRN

## 2020-07-31 MED ORDER — ACETAMINOPHEN 325 MG PO TABS: 650.0000 mg | ORAL_TABLET | Freq: Four times a day (QID) | ORAL | Status: DC | PRN

## 2020-07-31 MED ORDER — BICTEGRAVIR-EMTRICITAB-TENOFOV 50-200-25 MG PO TABS
1.0000 | ORAL_TABLET | Freq: Every day | ORAL | Status: DC
  Administered 2020-07-31 – 2020-08-02 (×3): 1 via ORAL
  Filled 2020-07-31 (×5): qty 1

## 2020-07-31 MED ORDER — PREDNISONE 20 MG PO TABS: 50.0000 mg | ORAL_TABLET | Freq: Every day | ORAL | Status: DC

## 2020-07-31 MED ORDER — ASCORBIC ACID 500 MG PO TABS
500.0000 mg | ORAL_TABLET | Freq: Every day | ORAL | Status: DC
  Administered 2020-07-31 – 2020-08-03 (×4): 500 mg via ORAL
  Filled 2020-07-31 (×4): qty 1

## 2020-07-31 MED ORDER — GUAIFENESIN-DM 100-10 MG/5ML PO SYRP: 10.0000 mL | ORAL_SOLUTION | ORAL | Status: DC | PRN

## 2020-07-31 MED ORDER — ZINC SULFATE 220 (50 ZN) MG PO CAPS
220.0000 mg | ORAL_CAPSULE | Freq: Every day | ORAL | Status: DC
  Administered 2020-07-31 – 2020-08-03 (×4): 220 mg via ORAL
  Filled 2020-07-31 (×4): qty 1

## 2020-07-31 MED ORDER — BARICITINIB 2 MG PO TABS
4.0000 mg | ORAL_TABLET | Freq: Every day | ORAL | Status: DC
  Administered 2020-07-31 – 2020-08-03 (×4): 4 mg via ORAL
  Filled 2020-07-31 (×4): qty 2

## 2020-07-31 MED ORDER — METHYLPREDNISOLONE SODIUM SUCC 125 MG IJ SOLR
0.5000 mg/kg | Freq: Two times a day (BID) | INTRAMUSCULAR | Status: DC
  Administered 2020-07-31 – 2020-08-03 (×7): 58.125 mg via INTRAVENOUS
  Filled 2020-07-31 (×7): qty 2

## 2020-07-31 MED ORDER — ALBUTEROL SULFATE HFA 108 (90 BASE) MCG/ACT IN AERS: 2.0000 | INHALATION_SPRAY | RESPIRATORY_TRACT | Status: DC | PRN

## 2020-07-31 MED ORDER — ENOXAPARIN SODIUM 40 MG/0.4ML ~~LOC~~ SOLN
40.0000 mg | SUBCUTANEOUS | Status: DC
  Administered 2020-07-31 – 2020-08-03 (×4): 40 mg via SUBCUTANEOUS
  Filled 2020-07-31 (×4): qty 0.4

## 2020-07-31 MED ORDER — CHLORHEXIDINE GLUCONATE CLOTH 2 % EX PADS
6.0000 | MEDICATED_PAD | Freq: Every day | CUTANEOUS | Status: DC
  Administered 2020-07-31: 6 via TOPICAL

## 2020-07-31 NOTE — Progress Notes (Signed)
Pt arrived to room 332 via WC from ICU. Oriented to room and safety procedures, states understanding.  O2@ 8lpm HFNC on, tele on, SR. Pt denies SOB, lungs clear to auscultation. Pt states occasional productive cough with small amount clear sputum expectorated. Skin warm and dry. IV site WNL, saline lock, flushes easily. Call bell in reach, advised to call for needs.

## 2020-07-31 NOTE — Progress Notes (Signed)
PROGRESS NOTE    Thomas Armstrong  [id number] DOB: 17-Feb-1971 DOA: 07/30/2020 PCP: [name], MD   Chief Complaint  Patient presents with  . Shortness of Breath    Brief Narrative:  As per H&P written by Dr. [name] on 07/30/2020 Thomas Armstrong is a 50 y.o. male with medical history significant for HIV and tobacco abuse. Patient presented to the ED with complaints of increasing difficulty breathing since was discharged from the hospital 3 days ago.  He reports he was discharged on 3 L, and he has been increasing his O2, now up to 6 to 8 L.  He denies chest pain.  He reports compliance with his medications prednisone, and remdesivir.  He has 1 more dose of remdesivir to receive as an outpatient. Due to increasing O2 requirements, patient and his wife are concerned, so patient came to the ED.    ED Course: Temperature 99.2.  Heart rate 80s to 100.  Blood pressure systolic [id number].  O2 sats 83% on 6 L, placed on high flow nasal cannula 13 L, sats 91 to 95%..  Unremarkable CBC BMP.  D-dimer 0.46.  CTA chest negative for PE, shows progression of bilateral multifocal COVID-pneumonia. Hospitalist to admit.  Assessment & Plan: 1-Acute respiratory failure with hypoxia in the setting of COVID-19 pneumonia -Recent admission and discharge on 3 L of oxygen supplementation has completed remdesivir infusion -reuiring 10 L nasal cannula supplementation -Worsening bilateral infiltrates appreciated -Continue treatment with IV steroids -started on baricitinib  -Continue to follow inflammatory markers. -Continue the use of incentive spirometer, flutter valve and as needed bronchodilator.  2-Human immunodeficiency virus (HIV) disease (HCC) -Stable vital -Last CD4 count in the 700 range -Continue the use of Biktarvy  3-TOBACCO ABUSE, HX OF -Patient congratulated on quitting smoking. -Advised to continue to be tobacco free.  4-Hypertension -Stable -Continue home antihypertensive  regimen -Vital signs.  5-Class 1 obesity -Low calorie diet, portion control increase physical activity discussed with patient. -Body mass index is 32.74 kg/m.  6-hyperglycemia -no prior hx of DM -most likely in the setting of steroids usage -will check A1C  DVT prophylaxis: Lovenox Code Status: Full code Family Communication: No family at bedside.  Patient planning to update wife over the phone. Disposition:   Status is: Inpatient  Dispo: The patient is from: Home              Anticipated d/c is to: Home              Anticipated d/c date is: 2-3 days, base on clinical improvement.               Patient currently no medically stable for discharge.  Patient still requiring significant amount of oxygen supplementation (10 L nasal cannula); short winded sensation with activity and complaining of intermittent dry cough spells.  Continue treatment with IV steroids and start baricitinib.    Consultants:   ID: Dr. Tommy Medal curbside for further recommendations and clearance using Baricitinib.   Procedures:  See below for x-ray reports.  Antimicrobials None  Subjective: Afebrile, no chest pain, no nausea vomiting reported.  Patient requiring 2 L of oxygen supplementation and is pressure winded sensation with activity.  Objective: Vitals:   07/31/20 0900 07/31/20 1000 07/31/20 1100 07/31/20 1122  BP: 116/60 (!) 153/96 139/80   Pulse: 77 92 90 80  Resp: 14     Temp:    98.1 F (36.7 C)  TempSrc:    Oral  SpO2: 95%  92% 93% 96%  Weight:      Height:        Intake/Output Summary (Last 24 hours) at 07/31/2020 1346 Last data filed at 07/31/2020 0731 Gross per 24 hour  Intake -  Output 400 ml  Net -400 ml   Filed Weights   07/30/20 1342  Weight: 115.7 kg    Examination:  General exam: Appears in no major distress currently; reporting short winded sensation with activity and is requiring 10 L of oxygen supplementation. Respiratory system: No wheezing, no crackles, no  using accessory muscles.  Positive rhonchi bilaterally. Cardiovascular system: S1 & S2 heard, RRR. No JVD, murmurs, rubs, gallops or clicks. No pedal edema. Gastrointestinal system: Abdomen is obese, nondistended, soft and nontender. No organomegaly or masses felt. Normal bowel sounds heard. Central nervous system: Alert and oriented. No focal neurological deficits. Extremities: No cyanosis or clubbing. Skin: No rashes, no petechiae. Psychiatry: Judgement and insight appear normal. Mood & affect appropriate.     Data Reviewed: I have personally reviewed following labs and imaging studies  CBC: Recent Labs  Lab 07/26/20 1135 07/26/20 1350 07/27/20 0919 07/30/20 1730 07/31/20 0816  WBC 4.0 4.4 5.9 8.6 8.4  NEUTROABS 3.4  --  5.0 7.8* 8.1*  HGB 15.8 15.2 16.5 16.0 15.4  HCT 47.7 46.4 50.5 48.8 47.2  MCV 88.5 89.2 89.5 88.9 89.4  PLT 166 166 180 293 778    Basic Metabolic Panel: Recent Labs  Lab 07/26/20 1135 07/26/20 1350 07/27/20 0919 07/30/20 1730 07/31/20 0816  NA 134*  --  137 136 137  K 3.3*  --  3.3* 3.9 4.1  CL 95*  --  101 98 99  CO2 26  --  26 28 25   GLUCOSE 107*  --  138* 115* 128*  BUN 16  --  21* 19 22*  CREATININE 1.28* 1.38* 1.08 1.10 0.98  CALCIUM 8.6*  --  8.9 9.0 8.7*  MG  --   --  2.2 2.3 2.4  PHOS  --   --  2.0*  --  3.3    GFR: Estimated Creatinine Clearance: 126 mL/min (by C-G formula based on SCr of 0.98 mg/dL).  Liver Function Tests: Recent Labs  Lab 07/26/20 1135 07/27/20 0919 07/31/20 0816  AST 59* 54* 42*  ALT 38 43 74*  ALKPHOS 46 49 49  BILITOT 0.5 0.3 0.6  PROT 7.2 7.1 6.6  ALBUMIN 3.8 3.5 3.1*    CBG: No results for input(s): GLUCAP in the last 168 hours.   Recent Results (from the past 240 hour(s))  Blood Culture (routine x 2)     Status: None   Collection Time: 07/26/20 11:35 AM   Specimen: BLOOD  Result Value Ref Range Status   Specimen Description BLOOD LEFT ANTECUBITAL  Final   Special Requests BOTTLES DRAWN  AEROBIC AND ANAEROBIC  Final   Culture   Final    NO GROWTH 5 DAYS Performed at [facility], [address], [place], [place] [address]    Report Status 07/31/2020 FINAL  Final  Blood Culture (routine x 2)     Status: None   Collection Time: 07/26/20 12:30 PM   Specimen: BLOOD  Result Value Ref Range Status   Specimen Description BLOOD LEFT HAND  Final   Special Requests   Final    BOTTLES DRAWN AEROBIC AND ANAEROBIC Blood Culture adequate volume   Culture   Final    NO GROWTH 5 DAYS Performed at [facility], [place]  [address], [place], [place] [address]    Report Status 07/31/2020 FINAL  Final     Radiology Studies: CT Angio Chest PE W and/or Wo Contrast  Result Date: 07/30/2020 CLINICAL DATA:  COVID-19 diagnosed 2 weeks ago, chest pain, hypoxia, short of breath EXAM: CT ANGIOGRAPHY CHEST WITH CONTRAST TECHNIQUE: Multidetector CT imaging of the chest was performed using the standard protocol during bolus administration of intravenous contrast. Multiplanar CT image reconstructions and MIPs were obtained to evaluate the vascular anatomy. CONTRAST:  45mL OMNIPAQUE IOHEXOL 350 MG/ML SOLN COMPARISON:  07/26/2020 FINDINGS: Cardiovascular: This is a technically adequate evaluation of the pulmonary vasculature. No filling defects or pulmonary emboli. The heart is unremarkable without pericardial effusion. No evidence of thoracic aortic aneurysm or dissection. Mediastinum/Nodes: No enlarged mediastinal, hilar, or axillary lymph nodes. Thyroid gland, trachea, and esophagus demonstrate no significant findings. Lungs/Pleura: There is multifocal bilateral airspace disease, more prominent than previous chest x-ray. No effusion or pneumothorax. Central airways are widely patent. Upper Abdomen: No acute abnormality. 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 Musculoskeletal: No acute or destructive bony lesions. Reconstructed images demonstrate no additional findings.  Review of the MIP images confirms the above findings. IMPRESSION: 1. No evidence of pulmonary embolus. 2. Progressive multifocal bilateral COVID-19 pneumonia. 3. 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 Electronically Signed   By: Randa Ngo M.D.   On: 07/30/2020 19:18    Scheduled Meds: . vitamin C  500 mg Oral Daily  . baricitinib  4 mg Oral Daily  . bictegravir-emtricitabine-tenofovir AF  1 tablet Oral Daily  . Chlorhexidine Gluconate Cloth  6 each Topical Daily  . enoxaparin (LOVENOX) injection  40 mg Subcutaneous Q24H  . methylPREDNISolone (SOLU-MEDROL) injection  0.5 mg/kg Intravenous Q12H   Followed by  . [START ON 08/03/2020] predniSONE  50 mg Oral Daily  . zinc sulfate  220 mg Oral Daily   Continuous Infusions:   LOS: 1 day    Time spent: 35 minutes   Barton Dubois, MD [facility]   To contact the attending provider between 7A-7P or the covering provider during after hours 7P-7A, please log into the web site [email or web address] and access using universal [place] password for that web site. If you do not have the password, please call the hospital operator.  07/31/2020, 1:46 PM

## 2020-07-31 NOTE — Progress Notes (Signed)
Report called to [employer] on [address]. Pt transferred via wheelchair.

## 2020-07-31 NOTE — Plan of Care (Signed)
  Problem: Education: Goal: Knowledge of General Education information will improve Description: Including pain rating scale, medication(s)/side effects and non-pharmacologic comfort measures Outcome: Progressing   Problem: Activity: Goal: Risk for activity intolerance will decrease Outcome: Progressing   Problem: Elimination: Goal: Will not experience complications related to bowel motility Outcome: Progressing   

## 2020-08-01 DIAGNOSIS — R7303 Prediabetes: Secondary | ICD-10-CM

## 2020-08-01 DIAGNOSIS — R0902 Hypoxemia: Secondary | ICD-10-CM

## 2020-08-01 LAB — CBC WITH DIFFERENTIAL/PLATELET
Abs Immature Granulocytes: 0.1 10*3/uL — ABNORMAL HIGH (ref 0.00–0.07)
Basophils Absolute: 0 10*3/uL (ref 0.0–0.1)
Basophils Relative: 0 %
Eosinophils Absolute: 0 10*3/uL (ref 0.0–0.5)
Eosinophils Relative: 0 %
HCT: 47.1 % (ref 39.0–52.0)
Hemoglobin: 15.4 g/dL (ref 13.0–17.0)
Immature Granulocytes: 1 %
Lymphocytes Relative: 6 %
Lymphs Abs: 0.6 10*3/uL — ABNORMAL LOW (ref 0.7–4.0)
MCH: 28.9 pg (ref 26.0–34.0)
MCHC: 32.7 g/dL (ref 30.0–36.0)
MCV: 88.5 fL (ref 80.0–100.0)
Monocytes Absolute: 0.3 10*3/uL (ref 0.1–1.0)
Monocytes Relative: 3 %
Neutro Abs: 10.3 10*3/uL — ABNORMAL HIGH (ref 1.7–7.7)
Neutrophils Relative %: 90 %
Platelets: 444 10*3/uL — ABNORMAL HIGH (ref 150–400)
RBC: 5.32 MIL/uL (ref 4.22–5.81)
RDW: 14.4 % (ref 11.5–15.5)
WBC: 11.4 10*3/uL — ABNORMAL HIGH (ref 4.0–10.5)
nRBC: 0 % (ref 0.0–0.2)

## 2020-08-01 LAB — COMPREHENSIVE METABOLIC PANEL
ALT: 71 U/L — ABNORMAL HIGH (ref 0–44)
AST: 37 U/L (ref 15–41)
Albumin: 3 g/dL — ABNORMAL LOW (ref 3.5–5.0)
Alkaline Phosphatase: 47 U/L (ref 38–126)
Anion gap: 11 (ref 5–15)
BUN: 27 mg/dL — ABNORMAL HIGH (ref 6–20)
CO2: 27 mmol/L (ref 22–32)
Calcium: 8.8 mg/dL — ABNORMAL LOW (ref 8.9–10.3)
Chloride: 100 mmol/L (ref 98–111)
Creatinine, Ser: 1.04 mg/dL (ref 0.61–1.24)
GFR, Estimated: >60 mL/min (ref >60)
Glucose, Bld: 130 mg/dL — ABNORMAL HIGH (ref 70–99)
Potassium: 4 mmol/L (ref 3.5–5.1)
Sodium: 138 mmol/L (ref 135–145)
Total Bilirubin: 0.6 mg/dL (ref 0.3–1.2)
Total Protein: 6.6 g/dL (ref 6.5–8.1)

## 2020-08-01 LAB — PHOSPHORUS: Phosphorus: 4.2 mg/dL (ref 2.5–4.6)

## 2020-08-01 LAB — HEMOGLOBIN A1C
Hgb A1c MFr Bld: 6 % — ABNORMAL HIGH (ref 4.8–5.6)
Mean Plasma Glucose: 125.5 mg/dL

## 2020-08-01 LAB — C-REACTIVE PROTEIN: CRP: 4.5 mg/dL — ABNORMAL HIGH (ref <1.0)

## 2020-08-01 LAB — FERRITIN: Ferritin: 717 ng/mL — ABNORMAL HIGH (ref 24–336)

## 2020-08-01 LAB — MAGNESIUM: Magnesium: 2.5 mg/dL — ABNORMAL HIGH (ref 1.7–2.4)

## 2020-08-01 LAB — D-DIMER, QUANTITATIVE: D-Dimer, Quant: 0.34 ug/mL-FEU (ref 0.00–0.50)

## 2020-08-01 NOTE — Progress Notes (Signed)
Ambulating was in 70's on room air.  Gradually increased O2 to 10 liters and rose to 85-86% and then had to sit down.  Resting O2 95 on 8 liters

## 2020-08-01 NOTE — Progress Notes (Signed)
IS and flutter given, instructed and pt had very good efforts.

## 2020-08-01 NOTE — ED Provider Notes (Signed)
[facility] EMERGENCY DEPARTMENT Provider Note   CSN: [id number] Arrival date & time: 07/26/20  [id number]     History Chief Complaint  Patient presents with  . Covid Positive  . Nausea    Thomas Armstrong is a 50 y.o. male.  Patient with history of HLD presents with Covid positive infection and hypoxemia.  Symptoms been ongoing for over a week.  O2 the patient dropping to 8990% slow.  Denies vomiting or diarrhea        Past Medical History:  Diagnosis Date  . HIV infection ([place])   . HTN, white coat 02/21/2016  . Hypertension   . Hypertension 04/30/2017    Patient Active Problem List   Diagnosis Date Noted  . Acute respiratory failure due to COVID-19 ([place]) 07/26/2020  . Hypertension 04/30/2017  . HTN, white coat 02/21/2016  . OTHER DISEASES OF NASAL CAVITY AND SINUSES 11/30/2009  . WEIGHT GAIN 11/30/2009  . TOBACCO ABUSE, HX OF 11/30/2009  . ALLERGIC RHINITIS, SEASONAL 09/28/2007  . Human immunodeficiency virus (HIV) disease ([place]) 08/18/2006    History reviewed. No pertinent surgical history.     Family History  Problem Relation Age of Onset  . Stroke Mother   . Stroke Father   . Heart disease Paternal Grandmother   . Stroke Paternal Grandmother     Social History   Tobacco Use  . Smoking status: Former Smoker    Quit date: 2020    Years since quitting: 2.0  . Smokeless tobacco: Never Used  Vaping Use  . Vaping Use: Some days  . Substances: Nicotine, Flavoring  Substance Use Topics  . Alcohol use: Yes    Alcohol/week: 0.0 standard drinks    Comment: rarely  . Drug use: No    Home Medications Prior to Admission medications   Medication Sig Start Date End Date Taking? Authorizing Provider  acetaminophen (TYLENOL) 500 MG tablet Take 1,000 mg by mouth every 6 (six) hours as needed.   Yes [provider]  albuterol (VENTOLIN HFA) 108 (90 Base) MCG/ACT inhaler Inhale 2 puffs into the lungs every 4 (four) hours as needed for wheezing or shortness  of breath. 07/27/20  Yes Johnson, Clanford L, MD  bictegravir-emtricitabine-tenofovir AF (BIKTARVY) 50-200-25 MG TABS tablet Take 1 tablet by mouth daily. 06/29/20  Yes Tommy Medal, Lavell Islam, MD  cetirizine (ZYRTEC) 10 MG tablet Take 10 mg by mouth daily.   Yes [provider]  Cholecalciferol (VITAMIN D3) 50 MCG (2000 UT) TABS Take 1 tablet by mouth daily.   Yes [provider]  predniSONE (DELTASONE) 50 MG tablet Take 1 tablet (50 mg total) by mouth daily with breakfast for 8 days. 07/28/20 08/05/20 Yes Johnson, Clanford L, MD  ascorbic acid (VITAMIN C) 500 MG tablet Take 1 tablet (500 mg total) by mouth daily. 07/28/20   Johnson, Clanford L, MD  guaiFENesin-dextromethorphan (ROBITUSSIN DM) 100-10 MG/5ML syrup Take 10 mLs by mouth every 4 (four) hours as needed for cough. 07/27/20   Johnson, Clanford L, MD  zinc sulfate 220 (50 Zn) MG capsule Take 1 capsule (220 mg total) by mouth daily. 07/28/20   Murlean Iba, MD    Allergies    Codeine  Review of Systems   Review of Systems  Constitutional: Positive for fever.  HENT: Negative for ear pain and sore throat.   Eyes: Negative for pain.  Respiratory: Positive for cough and shortness of breath.   Cardiovascular: Negative for chest pain.  Gastrointestinal: Negative for abdominal pain.  Genitourinary: Negative for flank pain.  Musculoskeletal: Negative for back pain.  Skin: Negative for color change and rash.  Neurological: Negative for syncope.  All other systems reviewed and are negative.   Physical Exam Updated Vital Signs BP 117/70 (BP Location: Right Arm)   Pulse (!) 109   Temp 98.1 F (36.7 C) (Oral)   Resp (!) 21   Ht 6\' 2"  (1.88 m)   Wt 115.7 kg   SpO2 90%   BMI 32.74 kg/m   Physical Exam Constitutional:      General: He is not in acute distress.    Appearance: He is well-developed.  HENT:     Head: Normocephalic.     Nose: Nose normal.  Eyes:     Extraocular Movements: Extraocular movements  intact.  Cardiovascular:     Rate and Rhythm: Normal rate.  Pulmonary:     Effort: Respiratory distress present.  Skin:    Coloration: Skin is not jaundiced.  Neurological:     Mental Status: He is alert. Mental status is at baseline.     ED Results / Procedures / Treatments   Labs (all labs ordered are listed, but only abnormal results are displayed) Labs Reviewed  CBC WITH DIFFERENTIAL/PLATELET - Abnormal; Notable for the following components:      Result Value   Lymphs Abs 0.5 (*)    All other components within normal limits  COMPREHENSIVE METABOLIC PANEL - Abnormal; Notable for the following components:   Sodium 134 (*)    Potassium 3.3 (*)    Chloride 95 (*)    Glucose, Bld 107 (*)    Creatinine, Ser 1.28 (*)    Calcium 8.6 (*)    AST 59 (*)    All other components within normal limits  D-DIMER, QUANTITATIVE (NOT AT [facility]) - Abnormal; Notable for the following components:   D-Dimer, Quant 0.56 (*)    All other components within normal limits  LACTATE DEHYDROGENASE - Abnormal; Notable for the following components:   LDH 343 (*)    All other components within normal limits  FERRITIN - Abnormal; Notable for the following components:   Ferritin 391 (*)    All other components within normal limits  FIBRINOGEN - Abnormal; Notable for the following components:   Fibrinogen 686 (*)    All other components within normal limits  C-REACTIVE PROTEIN - Abnormal; Notable for the following components:   CRP 4.1 (*)    All other components within normal limits  CREATININE, SERUM - Abnormal; Notable for the following components:   Creatinine, Ser 1.38 (*)    All other components within normal limits  CBC WITH DIFFERENTIAL/PLATELET - Abnormal; Notable for the following components:   Lymphs Abs 0.6 (*)    All other components within normal limits  COMPREHENSIVE METABOLIC PANEL - Abnormal; Notable for the following components:   Potassium 3.3 (*)    Glucose, Bld 138 (*)    BUN 21  (*)    AST 54 (*)    All other components within normal limits  C-REACTIVE PROTEIN - Abnormal; Notable for the following components:   CRP 3.6 (*)    All other components within normal limits  FERRITIN - Abnormal; Notable for the following components:   Ferritin 562 (*)    All other components within normal limits  PHOSPHORUS - Abnormal; Notable for the following components:   Phosphorus 2.0 (*)    All other components within normal limits  POC SARS CORONAVIRUS 2 AG -  ED - Abnormal; Notable for the following components:   SARS Coronavirus 2 Ag Positive (*)    All other components within normal limits  CULTURE, BLOOD (ROUTINE X 2)  CULTURE, BLOOD (ROUTINE X 2)  LACTIC ACID, PLASMA  LACTIC ACID, PLASMA  PROCALCITONIN  TRIGLYCERIDES  CBC  D-DIMER, QUANTITATIVE (NOT AT [facility])  MAGNESIUM    EKG EKG Interpretation  Date/Time:  Wednesday July 26 2020 11:19:44 EST Ventricular Rate:  119 PR Interval:    QRS Duration: 81 QT Interval:  304 QTC Calculation: 428 R Axis:   55 Text Interpretation: Sinus tachycardia Borderline T abnormalities, inferior leads No old tracing to compare Confirmed by Deno Etienne [phone]) on 07/28/2020 8:50:52 AM   Radiology CT Angio Chest PE W and/or Wo Contrast  Result Date: 07/30/2020 CLINICAL DATA:  COVID-19 diagnosed 2 weeks ago, chest pain, hypoxia, short of breath EXAM: CT ANGIOGRAPHY CHEST WITH CONTRAST TECHNIQUE: Multidetector CT imaging of the chest was performed using the standard protocol during bolus administration of intravenous contrast. Multiplanar CT image reconstructions and MIPs were obtained to evaluate the vascular anatomy. CONTRAST:  46mL OMNIPAQUE IOHEXOL 350 MG/ML SOLN COMPARISON:  07/26/2020 FINDINGS: Cardiovascular: This is a technically adequate evaluation of the pulmonary vasculature. No filling defects or pulmonary emboli. The heart is unremarkable without pericardial effusion. No evidence of thoracic aortic aneurysm or dissection.  Mediastinum/Nodes: No enlarged mediastinal, hilar, or axillary lymph nodes. Thyroid gland, trachea, and esophagus demonstrate no significant findings. Lungs/Pleura: There is multifocal bilateral airspace disease, more prominent than previous chest x-ray. No effusion or pneumothorax. Central airways are widely patent. Upper Abdomen: No acute abnormality. 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 Musculoskeletal: No acute or destructive bony lesions. Reconstructed images demonstrate no additional findings. Review of the MIP images confirms the above findings. IMPRESSION: 1. No evidence of pulmonary embolus. 2. Progressive multifocal bilateral COVID-19 pneumonia. 3. 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 Electronically Signed   By: Randa Ngo M.D.   On: 07/30/2020 19:18    Procedures .Critical Care Performed by: Luna Fuse, MD Authorized by: Luna Fuse, MD   Critical care provider statement:    Critical care time (minutes):  30   Critical care time was exclusive of:  Separately billable procedures and treating other patients   Critical care was necessary to treat or prevent imminent or life-threatening deterioration of the following conditions:  Respiratory failure     Medications Ordered in ED Medications  remdesivir 100 mg in sodium chloride 0.9 % 100 mL IVPB (0 mg Intravenous Hold 07/26/20 1404)  dexamethasone (DECADRON) injection 6 mg (6 mg Intravenous Given 07/26/20 1130)  acetaminophen (TYLENOL) tablet 650 mg (650 mg Oral Given 07/26/20 1130)  potassium chloride SA (KLOR-CON) CR tablet 40 mEq (40 mEq Oral Given 07/26/20 1521)  potassium chloride SA (KLOR-CON) CR tablet 60 mEq (60 mEq Oral Given 07/27/20 1326)    ED Course  I have reviewed the triage vital signs and the nursing notes.  Pertinent labs & imaging results that were available during my care of the patient were reviewed by me and  considered in my medical decision making (see chart for details).    MDM Rules/Calculators/A&P                          Patient presents with Covid positive status and hypoxemia.  On 2 L nasal cannula to maintain O2 saturation greater 95% which  is appropriate.  Started on IV steroids and.  Admitting hospitalist team.   Final Clinical Impression(s) / ED Diagnoses Final diagnoses:  COVID-19 virus infection  Hypoxemia    Rx / DC Orders ED Discharge Orders         Ordered    zinc sulfate 220 (50 Zn) MG capsule  Daily        07/27/20 1121    predniSONE (DELTASONE) 50 MG tablet  Daily with breakfast        07/27/20 1121    ascorbic acid (VITAMIN C) 500 MG tablet  Daily        07/27/20 1121    guaiFENesin-dextromethorphan (ROBITUSSIN DM) 100-10 MG/5ML syrup  Every 4 hours PRN        07/27/20 1121    albuterol (VENTOLIN HFA) 108 (90 Base) MCG/ACT inhaler  Every 4 hours PRN        07/27/20 [place], Ariannie Penaloza S, MD 08/01/20 0006

## 2020-08-01 NOTE — Progress Notes (Signed)
PROGRESS NOTE    Thomas Armstrong  YHC:[id number] DOB: 1970-10-29 DOA: 07/30/2020 PCP: [name], MD   Chief Complaint  Patient presents with  . Shortness of Breath    Brief Narrative:  As per H&P written by Dr. [name] on 07/30/2020 Thomas Armstrong is a 50 y.o. male with medical history significant for HIV and tobacco abuse. Patient presented to the ED with complaints of increasing difficulty breathing since was discharged from the hospital 3 days ago.  He reports he was discharged on 3 L, and he has been increasing his O2, now up to 6 to 8 L.  He denies chest pain.  He reports compliance with his medications prednisone, and remdesivir.  He has 1 more dose of remdesivir to receive as an outpatient. Due to increasing O2 requirements, patient and his wife are concerned, so patient came to the ED.    ED Course: Temperature 99.2.  Heart rate 80s to 100.  Blood pressure systolic 517O -160V.  O2 sats 83% on 6 L, placed on high flow nasal cannula 13 L, sats 91 to 95%..  Unremarkable CBC BMP.  D-dimer 0.46.  CTA chest negative for PE, shows progression of bilateral multifocal COVID-pneumonia. Hospitalist to admit.  Assessment & Plan: 1-Acute respiratory failure with hypoxia in the setting of COVID-19 pneumonia -Recent admission and discharge on 3 L of oxygen supplementation has completed remdesivir infusion -reuiring 8 L nasal cannula supplementation -Continue treatment with IV steroids -started on baricitinib on 07/31/20 as per ID rec's.  -Continue to follow inflammatory markers. -Continue the use of incentive spirometer, flutter valve and as needed bronchodilator. -reports feeling better today  2-Human immunodeficiency virus (HIV) disease (HCC) -Stable vital -Last CD4 count in the 700 range -Continue the use of Biktarvy  3-TOBACCO ABUSE, HX OF -Patient congratulated on quitting smoking. -Advised to continue to be tobacco free.  4-Hypertension -Stable -Continue home  antihypertensive regimen -Continue to follow vital signs and adjust antihypertensive regimen as needed.  5-Class 1 obesity -Low calorie diet, portion control increase physical activity discussed with patient. -Body mass index is 32.74 kg/m.  6-hyperglycemia -no prior hx of DM -most likely in the setting of steroids usage -A1c 6.0; meeting prediabetes range -Modified carbohydrate diet and weight loss are imperative at time of discharge.  DVT prophylaxis: Lovenox Code Status: Full code Family Communication: No family at bedside.  Patient planning to update wife over the phone. Disposition:   Status is: Inpatient  Dispo: The patient is from: Home              Anticipated d/c is to: Home              Anticipated d/c date is: 2-3 days, base on clinical improvement.               Patient currently no medically stable for discharge.  Patient still requiring significant amount of oxygen supplementation (8 L nasal cannula); short winded sensation with activity and complaining of intermittent dry cough spells.  Continue treatment with IV steroids and start baricitinib.    Consultants:   ID: Dr. Tommy Medal curbside for further recommendations and clearance using Baricitinib.   Procedures:  See below for x-ray reports.  Antimicrobials None  Subjective: No acute distress; reports improvement in his symptoms.  Still short winded sensation with activity reported and is using 8 L nasal supplementation.  Objective: Vitals:   07/31/20 2012 08/01/20 0000 08/01/20 0517 08/01/20 1451  BP: 128/77 125/72 122/78 122/72  Pulse:  96 79 96 78  Resp: 19 18  16   Temp: 98 F (36.7 C) 98.1 F (36.7 C) 98.8 F (37.1 C) 98.9 F (37.2 C)  TempSrc:   Oral Oral  SpO2: 92% 94% 95% 93%  Weight:      Height:        Intake/Output Summary (Last 24 hours) at 08/01/2020 1541 Last data filed at 08/01/2020 0500 Gross per 24 hour  Intake -  Output 650 ml  Net -650 ml   Filed Weights   07/30/20 1342   Weight: 115.7 kg    Examination: General exam: Alert, awake, oriented x 3; reports improvement in his symptoms; no chest pain, no nausea, no vomiting.  Using 8 L nasal cannula supplementation currently.  Patient expressing short winded sensation with activity. Respiratory system: No wheezing or crackles; positive scattered rhonchi. Cardiovascular system:RRR. No murmurs, rubs, gallops. Gastrointestinal system: Abdomen is nondistended, soft and nontender. No organomegaly or masses felt. Normal bowel sounds heard. Central nervous system: Alert and oriented. No focal neurological deficits. Extremities: No C/C/E, +pedal pulses Skin: No rashes, lesions or ulcers Psychiatry: Judgement and insight appear normal. Mood & affect appropriate.    Data Reviewed: I have personally reviewed following labs and imaging studies  CBC: Recent Labs  Lab 07/26/20 1135 07/26/20 1350 07/27/20 0919 07/30/20 1730 07/31/20 0816 08/01/20 0530  WBC 4.0 4.4 5.9 8.6 8.4 11.4*  NEUTROABS 3.4  --  5.0 7.8* 8.1* 10.3*  HGB 15.8 15.2 16.5 16.0 15.4 15.4  HCT 47.7 46.4 50.5 48.8 47.2 47.1  MCV 88.5 89.2 89.5 88.9 89.4 88.5  PLT 166 166 180 293 312 444*    Basic Metabolic Panel: Recent Labs  Lab 07/26/20 1135 07/26/20 1350 07/27/20 0919 07/30/20 1730 07/31/20 0816 08/01/20 0530  NA 134*  --  137 136 137 138  K 3.3*  --  3.3* 3.9 4.1 4.0  CL 95*  --  101 98 99 100  CO2 26  --  26 28 25 27   GLUCOSE 107*  --  138* 115* 128* 130*  BUN 16  --  21* 19 22* 27*  CREATININE 1.28* 1.38* 1.08 1.10 0.98 1.04  CALCIUM 8.6*  --  8.9 9.0 8.7* 8.8*  MG  --   --  2.2 2.3 2.4 2.5*  PHOS  --   --  2.0*  --  3.3 4.2    GFR: Estimated Creatinine Clearance: 118.7 mL/min (by C-G formula based on SCr of 1.04 mg/dL).  Liver Function Tests: Recent Labs  Lab 07/26/20 1135 07/27/20 0919 07/31/20 0816 08/01/20 0530  AST 59* 54* 42* 37  ALT 38 43 74* 71*  ALKPHOS 46 49 49 47  BILITOT 0.5 0.3 0.6 0.6  PROT 7.2 7.1  6.6 6.6  ALBUMIN 3.8 3.5 3.1* 3.0*    CBG: No results for input(s): GLUCAP in the last 168 hours.   Recent Results (from the past 240 hour(s))  Blood Culture (routine x 2)     Status: None   Collection Time: 07/26/20 11:35 AM   Specimen: BLOOD  Result Value Ref Range Status   Specimen Description BLOOD LEFT ANTECUBITAL  Final   Special Requests BOTTLES DRAWN AEROBIC AND ANAEROBIC  Final   Culture   Final    NO GROWTH 5 DAYS Performed at [facility], [address] [place],  [address]    Report Status 07/31/2020 FINAL  Final  Blood Culture (routine x 2)     Status: None   Collection Time: 07/26/20  12:30 PM   Specimen: BLOOD  Result Value Ref Range Status   Specimen Description BLOOD LEFT HAND  Final   Special Requests   Final    BOTTLES DRAWN AEROBIC AND ANAEROBIC Blood Culture adequate volume   Culture   Final    NO GROWTH 5 DAYS Performed at [facility], [address] [place], [place] [address]    Report Status 07/31/2020 FINAL  Final  MRSA PCR Screening     Status: None   Collection Time: 07/31/20  3:47 AM   Specimen: Nasal Mucosa; Nasopharyngeal  Result Value Ref Range Status   MRSA by PCR NEGATIVE NEGATIVE Final    Comment:        The GeneXpert MRSA Assay (FDA approved for NASAL specimens only), is one component of a comprehensive MRSA colonization surveillance program. It is not intended to diagnose MRSA infection nor to guide or monitor treatment for MRSA infections. Performed at [facility], [address] [place], [place] [address]      Radiology Studies: CT Angio Chest PE W and/or Wo Contrast  Result Date: 07/30/2020 CLINICAL DATA:  COVID-19 diagnosed 2 weeks ago, chest pain, hypoxia, short of breath EXAM: CT ANGIOGRAPHY CHEST WITH CONTRAST TECHNIQUE: Multidetector CT imaging of the chest was performed using the standard protocol during bolus administration of intravenous contrast. Multiplanar CT image reconstructions and MIPs were obtained to  evaluate the vascular anatomy. CONTRAST:  49mL OMNIPAQUE IOHEXOL 350 MG/ML SOLN COMPARISON:  07/26/2020 FINDINGS: Cardiovascular: This is a technically adequate evaluation of the pulmonary vasculature. No filling defects or pulmonary emboli. The heart is unremarkable without pericardial effusion. No evidence of thoracic aortic aneurysm or dissection. Mediastinum/Nodes: No enlarged mediastinal, hilar, or axillary lymph nodes. Thyroid gland, trachea, and esophagus demonstrate no significant findings. Lungs/Pleura: There is multifocal bilateral airspace disease, more prominent than previous chest x-ray. No effusion or pneumothorax. Central airways are widely patent. Upper Abdomen: No acute abnormality. 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 Musculoskeletal: No acute or destructive bony lesions. Reconstructed images demonstrate no additional findings. Review of the MIP images confirms the above findings. IMPRESSION: 1. No evidence of pulmonary embolus. 2. Progressive multifocal bilateral COVID-19 pneumonia. 3. 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 Electronically Signed   By: Randa Ngo M.D.   On: 07/30/2020 19:18    Scheduled Meds: . vitamin C  500 mg Oral Daily  . baricitinib  4 mg Oral Daily  . bictegravir-emtricitabine-tenofovir AF  1 tablet Oral Daily  . Chlorhexidine Gluconate Cloth  6 each Topical Daily  . enoxaparin (LOVENOX) injection  40 mg Subcutaneous Q24H  . methylPREDNISolone (SOLU-MEDROL) injection  0.5 mg/kg Intravenous Q12H   Followed by  . [START ON 08/03/2020] predniSONE  50 mg Oral Daily  . zinc sulfate  220 mg Oral Daily   Continuous Infusions:   LOS: 2 days    Time spent: 30 minutes   Barton Dubois, MD [facility]   To contact the attending provider between 7A-7P or the covering provider during after hours 7P-7A, please log into the web site [email or web address] and access using  universal [place] password for that web site. If you do not have the password, please call the hospital operator.  08/01/2020, 3:41 PM

## 2020-08-02 LAB — COMPREHENSIVE METABOLIC PANEL
ALT: 82 U/L — ABNORMAL HIGH (ref 0–44)
AST: 42 U/L — ABNORMAL HIGH (ref 15–41)
Albumin: 2.9 g/dL — ABNORMAL LOW (ref 3.5–5.0)
Alkaline Phosphatase: 57 U/L (ref 38–126)
Anion gap: 12 (ref 5–15)
BUN: 32 mg/dL — ABNORMAL HIGH (ref 6–20)
CO2: 27 mmol/L (ref 22–32)
Calcium: 8.6 mg/dL — ABNORMAL LOW (ref 8.9–10.3)
Chloride: 101 mmol/L (ref 98–111)
Creatinine, Ser: 1.07 mg/dL (ref 0.61–1.24)
GFR, Estimated: >60 mL/min (ref >60)
Glucose, Bld: 134 mg/dL — ABNORMAL HIGH (ref 70–99)
Potassium: 3.8 mmol/L (ref 3.5–5.1)
Sodium: 140 mmol/L (ref 135–145)
Total Bilirubin: 0.6 mg/dL (ref 0.3–1.2)
Total Protein: 6.2 g/dL — ABNORMAL LOW (ref 6.5–8.1)

## 2020-08-02 LAB — CBC WITH DIFFERENTIAL/PLATELET
Abs Immature Granulocytes: 0.16 10*3/uL — ABNORMAL HIGH (ref 0.00–0.07)
Basophils Absolute: 0 10*3/uL (ref 0.0–0.1)
Basophils Relative: 0 %
Eosinophils Absolute: 0 10*3/uL (ref 0.0–0.5)
Eosinophils Relative: 0 %
HCT: 48.8 % (ref 39.0–52.0)
Hemoglobin: 15.7 g/dL (ref 13.0–17.0)
Immature Granulocytes: 1 %
Lymphocytes Relative: 4 %
Lymphs Abs: 0.5 10*3/uL — ABNORMAL LOW (ref 0.7–4.0)
MCH: 28.8 pg (ref 26.0–34.0)
MCHC: 32.2 g/dL (ref 30.0–36.0)
MCV: 89.5 fL (ref 80.0–100.0)
Monocytes Absolute: 0.5 10*3/uL (ref 0.1–1.0)
Monocytes Relative: 4 %
Neutro Abs: 12.8 10*3/uL — ABNORMAL HIGH (ref 1.7–7.7)
Neutrophils Relative %: 91 %
Platelets: 458 10*3/uL — ABNORMAL HIGH (ref 150–400)
RBC: 5.45 MIL/uL (ref 4.22–5.81)
RDW: 14.5 % (ref 11.5–15.5)
WBC: 13.9 10*3/uL — ABNORMAL HIGH (ref 4.0–10.5)
nRBC: 0 % (ref 0.0–0.2)

## 2020-08-02 LAB — C-REACTIVE PROTEIN: CRP: 1.1 mg/dL — ABNORMAL HIGH (ref <1.0)

## 2020-08-02 LAB — D-DIMER, QUANTITATIVE: D-Dimer, Quant: 0.3 ug/mL-FEU (ref 0.00–0.50)

## 2020-08-02 LAB — PHOSPHORUS: Phosphorus: 5 mg/dL — ABNORMAL HIGH (ref 2.5–4.6)

## 2020-08-02 LAB — FERRITIN: Ferritin: 556 ng/mL — ABNORMAL HIGH (ref 24–336)

## 2020-08-02 LAB — MAGNESIUM: Magnesium: 2.6 mg/dL — ABNORMAL HIGH (ref 1.7–2.4)

## 2020-08-02 MED ORDER — SALINE SPRAY 0.65 % NA SOLN: 1.0000 | NASAL | Status: DC | PRN

## 2020-08-02 NOTE — Progress Notes (Signed)
PROGRESS NOTE    Thomas Armstrong  HCW:[id number] DOB: 11/02/1970 DOA: 07/30/2020 PCP: [name], MD   Brief Narrative:  As per H&P written by Dr. [name] on 07/30/2020 Thomas Armstrong a 50 y.o.malewith medical history significant forHIV and tobacco abuse. Patient presented to the ED with complaints of increasing difficulty breathing since was discharged from the hospital 3 days ago. He reports he was discharged on 3 L, and he has been increasing his O2,now up to 6 to 8 L. He denies chest pain. He reports compliance with his medications prednisone, and remdesivir. He has 1 more dose of remdesivir to receive as an outpatient. Due to increasing O2 requirements, patient and his wife are concerned, so patient came to the ED.  ED Course:Temperature 99.2. Heart rate 80s to 100.Blood pressure systolic 176H -607P. O2 sats 83% on 6 L, placed on high flow nasal cannula 13 L, sats 91 to 95%.. Unremarkable CBC BMP. D-dimer 0.46. CTA chest negative for PE, shows progression of bilateral multifocal COVID-pneumonia. Hospitalist to admit.  Assessment & Plan:   Principal Problem:   Acute respiratory failure due to COVID-19 [facility]) Active Problems:   Human immunodeficiency virus (HIV) disease (HCC)   TOBACCO ABUSE, HX OF   Hypertension   1-Acute respiratory failure with hypoxia in the setting of COVID-19 pneumonia -Recent admission and discharge on 3 L of oxygen supplementation has completed remdesivir infusion -reuiring 8 L nasal cannula supplementation -Continue treatment with IV steroids -started on baricitinib on 07/31/20 as per ID rec's.  -Continue to follow inflammatory markers. -Continue the use of incentive spirometer, flutter valve and as needed bronchodilator. -reports feeling better today  2-Human immunodeficiency virus (HIV) disease (HCC) -Stable vital -Last CD4 count in the 700 range -Continue the use of Biktarvy  3-TOBACCO ABUSE, HX OF -Patient  congratulated on quitting smoking. -Advised to continue to be tobacco free.  4-Hypertension -Stable -Continue home antihypertensive regimen -Continue to follow vital signs and adjust antihypertensive regimen as needed.  5-Class 1 obesity -Low calorie diet, portion control increase physical activity discussed with patient. -Body mass index is 32.74 kg/m.  6-hyperglycemia -no prior hx of DM -most likely in the setting of steroids usage -A1c 6.0; meeting prediabetes range -Modified carbohydrate diet and weight loss are imperative at time of discharge.  DVT prophylaxis: Lovenox Code Status: Full code Family Communication: No family at bedside.  Patient planning to update wife over the phone. Disposition:   Status is: Inpatient  Dispo: The patient is from: Home  Anticipated d/c is to: Home  Anticipated d/c date is: 2-3 days, base on clinical improvement.   Patient currently no medically stable for discharge.  Patient still requiring significant amount of oxygen supplementation (8 L nasal cannula); short winded sensation with activity and complaining of intermittent dry cough spells.  Continue treatment with IV steroids and start baricitinib.    Consultants:   ID: Dr. Tommy Medal curbside for further recommendations and clearance using Baricitinib.   Procedures:  See below for x-ray reports.  Antimicrobials Anti-infectives (From admission, onward)   Start     Dose/Rate Route Frequency Ordered Stop   07/31/20 0415  bictegravir-emtricitabine-tenofovir AF (BIKTARVY) 50-200-25 MG per tablet 1 tablet        1 tablet Oral Daily 07/31/20 0354        Subjective: Patient seen and evaluated today with no new acute complaints or concerns. No acute concerns or events noted overnight.  Objective: Vitals:   08/02/20 0538 08/02/20 1100 08/02/20 1200 08/02/20  1420  BP: 123/79   136/84  Pulse: 94 92 96 96  Resp: 19 18 18 19   Temp: 97.9 F  (36.6 C)   98.8 F (37.1 C)  TempSrc: Oral   Oral  SpO2: 92% 91% 91% 91%  Weight:      Height:        Intake/Output Summary (Last 24 hours) at 08/02/2020 1516 Last data filed at 08/02/2020 1300 Gross per 24 hour  Intake 480 ml  Output --  Net 480 ml   Filed Weights   07/30/20 1342  Weight: 115.7 kg    Examination:  General exam: Appears calm and comfortable  Respiratory system: Clear to auscultation. Respiratory effort normal.  Patient currently on 8 L nasal cannula oxygen. Cardiovascular system: S1 & S2 heard, RRR.  Gastrointestinal system: Abdomen is soft Central nervous system: Alert and awake Extremities: No edema Skin: No significant lesions noted Psychiatry: Flat affect.    Data Reviewed: I have personally reviewed following labs and imaging studies  CBC: Recent Labs  Lab 07/27/20 0919 07/30/20 1730 07/31/20 0816 08/01/20 0530 08/02/20 0526  WBC 5.9 8.6 8.4 11.4* 13.9*  NEUTROABS 5.0 7.8* 8.1* 10.3* 12.8*  HGB 16.5 16.0 15.4 15.4 15.7  HCT 50.5 48.8 47.2 47.1 48.8  MCV 89.5 88.9 89.4 88.5 89.5  PLT 180 293 312 444* [id number]*   Basic Metabolic Panel: Recent Labs  Lab 07/27/20 0919 07/30/20 1730 07/31/20 0816 08/01/20 0530 08/02/20 0526  NA 137 136 137 138 140  K 3.3* 3.9 4.1 4.0 3.8  CL 101 98 99 100 101  CO2 26 28 25 27 27   GLUCOSE 138* 115* 128* 130* 134*  BUN 21* 19 22* 27* 32*  CREATININE 1.08 1.10 0.98 1.04 1.07  CALCIUM 8.9 9.0 8.7* 8.8* 8.6*  MG 2.2 2.3 2.4 2.5* 2.6*  PHOS 2.0*  --  3.3 4.2 5.0*   GFR: Estimated Creatinine Clearance: 115.4 mL/min (by C-G formula based on SCr of 1.07 mg/dL). Liver Function Tests: Recent Labs  Lab 07/27/20 0919 07/31/20 0816 08/01/20 0530 08/02/20 0526  AST 54* 42* 37 42*  ALT 43 74* 71* 82*  ALKPHOS 49 49 47 57  BILITOT 0.3 0.6 0.6 0.6  PROT 7.1 6.6 6.6 6.2*  ALBUMIN 3.5 3.1* 3.0* 2.9*   No results for input(s): LIPASE, AMYLASE in the last 168 hours. No results for input(s): AMMONIA in the  last 168 hours. Coagulation Profile: No results for input(s): INR, PROTIME in the last 168 hours. Cardiac Enzymes: No results for input(s): CKTOTAL, CKMB, CKMBINDEX, TROPONINI in the last 168 hours. BNP (last 3 results) No results for input(s): PROBNP in the last 8760 hours. HbA1C: Recent Labs    08/01/20 0530  HGBA1C 6.0*   CBG: No results for input(s): GLUCAP in the last 168 hours. Lipid Profile: Recent Labs    07/30/20 1730  TRIG 71   Thyroid Function Tests: No results for input(s): TSH, T4TOTAL, FREET4, T3FREE, THYROIDAB in the last 72 hours. Anemia Panel: Recent Labs    08/01/20 0530 08/02/20 0526  FERRITIN 717* 556*   Sepsis Labs: Recent Labs  Lab 07/30/20 1730  PROCALCITON <0.10    Recent Results (from the past 240 hour(s))  Blood Culture (routine x 2)     Status: None   Collection Time: 07/26/20 11:35 AM   Specimen: BLOOD  Result Value Ref Range Status   Specimen Description BLOOD LEFT ANTECUBITAL  Final   Special Requests BOTTLES DRAWN AEROBIC AND ANAEROBIC  Final  Culture   Final    NO GROWTH 5 DAYS Performed at [facility], [address], [place], [place] [address]    Report Status 07/31/2020 FINAL  Final  Blood Culture (routine x 2)     Status: None   Collection Time: 07/26/20 12:30 PM   Specimen: BLOOD  Result Value Ref Range Status   Specimen Description BLOOD LEFT HAND  Final   Special Requests   Final    BOTTLES DRAWN AEROBIC AND ANAEROBIC Blood Culture adequate volume   Culture   Final    NO GROWTH 5 DAYS Performed at [facility], [address] [place], [place] [address]    Report Status 07/31/2020 FINAL  Final  MRSA PCR Screening     Status: None   Collection Time: 07/31/20  3:47 AM   Specimen: Nasal Mucosa; Nasopharyngeal  Result Value Ref Range Status   MRSA by PCR NEGATIVE NEGATIVE Final    Comment:        The GeneXpert MRSA Assay (FDA approved for NASAL specimens only), is one component of a comprehensive MRSA  colonization surveillance program. It is not intended to diagnose MRSA infection nor to guide or monitor treatment for MRSA infections. Performed at [facility], [address] [place], [place] [address]          Radiology Studies: No results found.      Scheduled Meds: . vitamin C  500 mg Oral Daily  . baricitinib  4 mg Oral Daily  . bictegravir-emtricitabine-tenofovir AF  1 tablet Oral Daily  . Chlorhexidine Gluconate Cloth  6 each Topical Daily  . enoxaparin (LOVENOX) injection  40 mg Subcutaneous Q24H  . methylPREDNISolone (SOLU-MEDROL) injection  0.5 mg/kg Intravenous Q12H  . zinc sulfate  220 mg Oral Daily    LOS: 3 days    Time spent: 35 minutes    Thomas Hutchinson Darleen Crocker, DO [facility]  If 7PM-7AM, please contact night-coverage [email or web address] 08/02/2020, 3:16 PM

## 2020-08-02 NOTE — Progress Notes (Signed)
1000: SaO2 95% on 8 lpm HFNC. Pt's O2 decreased to 7 lpm HFNC. Pt advised of decrease and agreeable, advised to call if any increased SOB or resp diffficulty. Pt stated understanding. 1100: Pt's SaO2 94% on 7 lpm HFNC, HR 91 bpm.  Pt requested to take a shower. Pt ambulated to bathroom and heart rate up to 130 bpm and SaO2 down to 81% with activity, pt with noted dyspnea. With 5 minutes of rest, HR down to 110 bpm and SaO2 up to 86%. O2 increased back to 8lpm HFNC during pt's shower.  1130: Shower completed, pt dressed and back to bed. SaO2 91% on 8 lpm HFNC. O2 decreased again to 7 lpm. 1200: SaO2 91%, HR 96 on 7 lpm HFNC. Pt denies any c/o SOB and no noted dyspnea at rest.

## 2020-08-03 LAB — COMPREHENSIVE METABOLIC PANEL
ALT: 67 U/L — ABNORMAL HIGH (ref 0–44)
AST: 29 U/L (ref 15–41)
Albumin: 2.8 g/dL — ABNORMAL LOW (ref 3.5–5.0)
Alkaline Phosphatase: 55 U/L (ref 38–126)
Anion gap: 10 (ref 5–15)
BUN: 27 mg/dL — ABNORMAL HIGH (ref 6–20)
CO2: 25 mmol/L (ref 22–32)
Calcium: 8.7 mg/dL — ABNORMAL LOW (ref 8.9–10.3)
Chloride: 103 mmol/L (ref 98–111)
Creatinine, Ser: 1.01 mg/dL (ref 0.61–1.24)
GFR, Estimated: >60 mL/min (ref >60)
Glucose, Bld: 119 mg/dL — ABNORMAL HIGH (ref 70–99)
Potassium: 3.9 mmol/L (ref 3.5–5.1)
Sodium: 138 mmol/L (ref 135–145)
Total Bilirubin: 0.5 mg/dL (ref 0.3–1.2)
Total Protein: 6.2 g/dL — ABNORMAL LOW (ref 6.5–8.1)

## 2020-08-03 LAB — CBC WITH DIFFERENTIAL/PLATELET
Abs Immature Granulocytes: 0.24 10*3/uL — ABNORMAL HIGH (ref 0.00–0.07)
Basophils Absolute: 0 10*3/uL (ref 0.0–0.1)
Basophils Relative: 0 %
Eosinophils Absolute: 0 10*3/uL (ref 0.0–0.5)
Eosinophils Relative: 0 %
HCT: 48.4 % (ref 39.0–52.0)
Hemoglobin: 15.6 g/dL (ref 13.0–17.0)
Immature Granulocytes: 2 %
Lymphocytes Relative: 4 %
Lymphs Abs: 0.5 10*3/uL — ABNORMAL LOW (ref 0.7–4.0)
MCH: 28.8 pg (ref 26.0–34.0)
MCHC: 32.2 g/dL (ref 30.0–36.0)
MCV: 89.3 fL (ref 80.0–100.0)
Monocytes Absolute: 0.5 10*3/uL (ref 0.1–1.0)
Monocytes Relative: 4 %
Neutro Abs: 11 10*3/uL — ABNORMAL HIGH (ref 1.7–7.7)
Neutrophils Relative %: 90 %
Platelets: 467 10*3/uL — ABNORMAL HIGH (ref 150–400)
RBC: 5.42 MIL/uL (ref 4.22–5.81)
RDW: 14.7 % (ref 11.5–15.5)
WBC: 12.2 10*3/uL — ABNORMAL HIGH (ref 4.0–10.5)
nRBC: 0 % (ref 0.0–0.2)

## 2020-08-03 LAB — MAGNESIUM: Magnesium: 2.4 mg/dL (ref 1.7–2.4)

## 2020-08-03 LAB — D-DIMER, QUANTITATIVE: D-Dimer, Quant: 0.46 ug/mL-FEU (ref 0.00–0.50)

## 2020-08-03 LAB — PHOSPHORUS: Phosphorus: 4.2 mg/dL (ref 2.5–4.6)

## 2020-08-03 LAB — FERRITIN: Ferritin: 547 ng/mL — ABNORMAL HIGH (ref 24–336)

## 2020-08-03 LAB — C-REACTIVE PROTEIN: CRP: 0.7 mg/dL (ref <1.0)

## 2020-08-03 MED ORDER — PREDNISONE 10 MG PO TABS: 50.0000 mg | ORAL_TABLET | Freq: Every day | ORAL | 0 refills | Status: AC

## 2020-08-03 MED ORDER — METHYLPREDNISOLONE SODIUM SUCC 40 MG IJ SOLR: 40.0000 mg | Freq: Two times a day (BID) | INTRAMUSCULAR | Status: DC

## 2020-08-03 MED ORDER — SALINE SPRAY 0.65 % NA SOLN: 1.0000 | NASAL | 2 refills | Status: DC | PRN

## 2020-08-03 NOTE — Progress Notes (Signed)
Pt discharged via WC to POV with home O2 @ 4lpm [place] on and all personal belongings in his possession.

## 2020-08-03 NOTE — Progress Notes (Signed)
Pt's SaO2 on 5 lpm HFNC 94%, HR 92, RR 16/min at rest. Pt ambulated 69ft in room, HR up to 140 and SaO2 down to 84%, RR 24/min. Pt recovered to HR of 92 and SaO2 of 92% after 3 minutes of rest. Pt tolerated well, states didn't really feel SOB with ambulation. O2 remains at 5lpm HFNC, advised pt will repeat ambulation challenge in one hour. Pt states understanding.

## 2020-08-03 NOTE — Progress Notes (Signed)
Pt's SaO2 on 6 lpm HFNC 97%. O2 decreased to 5 lpm HFNC. Pt aware and agreeable. Advised pt to notify staff if increased SOB or resp difficulty.

## 2020-08-03 NOTE — Progress Notes (Signed)
Pt's HR 99, SaO2 93% and RR 16/min at rest on 5 lpm HFNC. Pt ambulated 96 ft in room. HR up to 140, SaO2 down to 85% and RR up to 21/min. Pt denies any c/o SOB. Tolerated well. Pt recovered to HR 92, SaO2 93% and RR 18 after 3 minutes of rest. Pt's O2 decreased to 4 lpm [place]. Advised pt that we would repeat SaO2 check and ambulation challenge after lunch. Pt aware and agreeable

## 2020-08-03 NOTE — Progress Notes (Signed)
PROGRESS NOTE    Thomas Armstrong  PYP:[id number] DOB: 1970/09/13 DOA: 07/30/2020 PCP: [name], MD   Brief Narrative:  As per H&P written by Dr. [name] on 07/30/2020 Thomas Armstrong a 50 y.o.malewith medical history significant forHIV and tobacco abuse. Patient presented to the ED with complaints of increasing difficulty breathing since was discharged from the hospital 3 days ago. He reports he was discharged on 3 L, and he has been increasing his O2,now up to 6 to 8 L. He denies chest pain. He reports compliance with his medications prednisone, and remdesivir. He has 1 more dose of remdesivir to receive as an outpatient. Due to increasing O2 requirements, patient and his wife are concerned, so patient came to the ED.  ED Course:Temperature 99.2. Heart rate 80s to 100.Blood pressure systolic 245Y -099I. O2 sats 83% on 6 L, placed on high flow nasal cannula 13 L, sats 91 to 95%.. Unremarkable CBC BMP. D-dimer 0.46. CTA chest negative for PE, shows progression of bilateral multifocal COVID-pneumonia. Hospitalist to admit.   Assessment & Plan:   Principal Problem:   Acute respiratory failure due to COVID-19 [facility]) Active Problems:   Human immunodeficiency virus (HIV) disease (HCC)   TOBACCO ABUSE, HX OF   Hypertension   1-Acute respiratory failure with hypoxia in the setting of COVID-19 pneumonia -Recent admission and discharge on 3 L of oxygen supplementation has completed remdesivir infusion -reuiring5L nasal cannula supplementation -Continue treatment with IV steroids -started on baricitinibon 07/31/20 as per ID rec's. -Continue to follow inflammatory markers. -Continue the use of incentive spirometer, flutter valve and as needed bronchodilator. -reports feeling better each day  2-Human immunodeficiency virus (HIV) disease (HCC) -Stable vital -Last CD4 count in the 700 range -Continue the use of Biktarvy  3-TOBACCO ABUSE, HX OF -Patient  congratulated on quitting smoking. -Advised to continue to be tobacco free.  4-Hypertension -Stable -Continue home antihypertensive regimen -Continue to follow vital signs and adjust antihypertensive regimen as needed.  5-Class 1 obesity -Low calorie diet, portion control increase physical activity discussed with patient. -Body mass index is 32.74 kg/m.  6-hyperglycemia -no prior hx of DM -most likely in the setting of steroids usage -A1c 6.0;meeting prediabetes range -Modified carbohydrate diet and weight loss are imperative at time of discharge.  DVT prophylaxis:Lovenox Code Status:Full code Family Communication:No family at bedside. Patient planning to update wife over the phone. Disposition:  Status is: Inpatient  Dispo: The patient is from:Home Anticipated d/c is to: Home Anticipated d/c date is: 1-2 days, base on clinical improvement.  Patient currently no medically stable for discharge. Patient still requiring significant amount of oxygen supplementation (5L nasal cannula); short winded sensation with activity and complaining of intermittent dry cough spells. Continue treatment with IV steroids and baricitinib.   Consultants:  ID: Dr. Tommy Medal curbside for further recommendations and clearance using Baricitinib.   Procedures: See below for x-ray reports.  Antimicrobials:  Anti-infectives (From admission, onward)   Start     Dose/Rate Route Frequency Ordered Stop   07/31/20 0415  bictegravir-emtricitabine-tenofovir AF (BIKTARVY) 50-200-25 MG per tablet 1 tablet        1 tablet Oral Daily 07/31/20 0354         Subjective: Patient seen and evaluated today with no new acute complaints or concerns. No acute concerns or events noted overnight.  Objective: Vitals:   08/02/20 1420 08/02/20 1945 08/02/20 2016 08/03/20 0620  BP: 136/84  (!) 146/79 (!) 131/93  Pulse: 96  79 87  Resp:  19   17  Temp: 98.8  F (37.1 C)  98.1 F (36.7 C) 98.4 F (36.9 C)  TempSrc: Oral   Oral  SpO2: 91% (!) 89% 95% 95%  Weight:      Height:        Intake/Output Summary (Last 24 hours) at 08/03/2020 1239 Last data filed at 08/03/2020 0900 Gross per 24 hour  Intake 720 ml  Output --  Net 720 ml   Filed Weights   07/30/20 1342  Weight: 115.7 kg    Examination:  General exam: Appears calm and comfortable  Respiratory system: Clear to auscultation. Respiratory effort normal. Currently on 5L [place]. Cardiovascular system: S1 & S2 heard, RRR.  Gastrointestinal system: Abdomen is soft Central nervous system: Alert and awake Extremities: No edema Skin: No significant lesions noted Psychiatry: Flat affect.    Data Reviewed: I have personally reviewed following labs and imaging studies  CBC: Recent Labs  Lab 07/30/20 1730 07/31/20 0816 08/01/20 0530 08/02/20 0526 08/03/20 0635  WBC 8.6 8.4 11.4* 13.9* 12.2*  NEUTROABS 7.8* 8.1* 10.3* 12.8* 11.0*  HGB 16.0 15.4 15.4 15.7 15.6  HCT 48.8 47.2 47.1 48.8 48.4  MCV 88.9 89.4 88.5 89.5 89.3  PLT 293 312 444* 458* 0000000*   Basic Metabolic Panel: Recent Labs  Lab 07/30/20 1730 07/31/20 0816 08/01/20 0530 08/02/20 0526 08/03/20 0635  NA 136 137 138 140 138  K 3.9 4.1 4.0 3.8 3.9  CL 98 99 100 101 103  CO2 28 25 27 27 25   GLUCOSE 115* 128* 130* 134* 119*  BUN 19 22* 27* 32* 27*  CREATININE 1.10 0.98 1.04 1.07 1.01  CALCIUM 9.0 8.7* 8.8* 8.6* 8.7*  MG 2.3 2.4 2.5* 2.6* 2.4  PHOS  --  3.3 4.2 5.0* 4.2   GFR: Estimated Creatinine Clearance: 122.3 mL/min (by C-G formula based on SCr of 1.01 mg/dL). Liver Function Tests: Recent Labs  Lab 07/31/20 0816 08/01/20 0530 08/02/20 0526 08/03/20 0635  AST 42* 37 42* 29  ALT 74* 71* 82* 67*  ALKPHOS 49 47 57 55  BILITOT 0.6 0.6 0.6 0.5  PROT 6.6 6.6 6.2* 6.2*  ALBUMIN 3.1* 3.0* 2.9* 2.8*   No results for input(s): LIPASE, AMYLASE in the last 168 hours. No results for input(s): AMMONIA in the  last 168 hours. Coagulation Profile: No results for input(s): INR, PROTIME in the last 168 hours. Cardiac Enzymes: No results for input(s): CKTOTAL, CKMB, CKMBINDEX, TROPONINI in the last 168 hours. BNP (last 3 results) No results for input(s): PROBNP in the last 8760 hours. HbA1C: Recent Labs    08/01/20 0530  HGBA1C 6.0*   CBG: No results for input(s): GLUCAP in the last 168 hours. Lipid Profile: No results for input(s): CHOL, HDL, LDLCALC, TRIG, CHOLHDL, LDLDIRECT in the last 72 hours. Thyroid Function Tests: No results for input(s): TSH, T4TOTAL, FREET4, T3FREE, THYROIDAB in the last 72 hours. Anemia Panel: Recent Labs    08/02/20 0526 08/03/20 0635  FERRITIN 556* 547*   Sepsis Labs: Recent Labs  Lab 07/30/20 1730  PROCALCITON <0.10    Recent Results (from the past 240 hour(s))  Blood Culture (routine x 2)     Status: None   Collection Time: 07/26/20 11:35 AM   Specimen: BLOOD  Result Value Ref Range Status   Specimen Description BLOOD LEFT ANTECUBITAL  Final   Special Requests BOTTLES DRAWN AEROBIC AND ANAEROBIC  Final   Culture   Final    NO GROWTH 5 DAYS Performed at  [facility], [address], [place], [place] [address]    Report Status 07/31/2020 FINAL  Final  Blood Culture (routine x 2)     Status: None   Collection Time: 07/26/20 12:30 PM   Specimen: BLOOD  Result Value Ref Range Status   Specimen Description BLOOD LEFT HAND  Final   Special Requests   Final    BOTTLES DRAWN AEROBIC AND ANAEROBIC Blood Culture adequate volume   Culture   Final    NO GROWTH 5 DAYS Performed at [facility], [address] [place], [place] [address]    Report Status 07/31/2020 FINAL  Final  MRSA PCR Screening     Status: None   Collection Time: 07/31/20  3:47 AM   Specimen: Nasal Mucosa; Nasopharyngeal  Result Value Ref Range Status   MRSA by PCR NEGATIVE NEGATIVE Final    Comment:        The GeneXpert MRSA Assay (FDA approved for NASAL specimens only), is  one component of a comprehensive MRSA colonization surveillance program. It is not intended to diagnose MRSA infection nor to guide or monitor treatment for MRSA infections. Performed at [facility], [address], [place], [place] [address]          Radiology Studies: No results found.      Scheduled Meds: . vitamin C  500 mg Oral Daily  . baricitinib  4 mg Oral Daily  . bictegravir-emtricitabine-tenofovir AF  1 tablet Oral Daily  . Chlorhexidine Gluconate Cloth  6 each Topical Daily  . enoxaparin (LOVENOX) injection  40 mg Subcutaneous Q24H  . methylPREDNISolone (SOLU-MEDROL) injection  40 mg Intravenous Q12H  . zinc sulfate  220 mg Oral Daily    LOS: 4 days    Time spent: 35 minutes    Barnet Benavides Darleen Crocker, DO [facility]  If 7PM-7AM, please contact night-coverage [email or web address] 08/03/2020, 12:39 PM

## 2020-08-03 NOTE — Discharge Summary (Signed)
Physician Discharge Summary  [name] [id number] DOB: Jan 13, 1971 DOA: 07/30/2020  PCP: [name], MD  Admit date: 07/30/2020  Discharge date: 08/03/2020  Admitted From:Home  Disposition:  Home  Recommendations for Outpatient Follow-up:  1. Follow up with PCP in 1-2 weeks 2. Continue on prednisone as prescribed to complete course of treatment 3. Continue home vitamins and albuterol inhaler as well as antitussives as previously prescribed  Home Health: None  Equipment/Devices: Has home O2 4 L requirement  Discharge Condition:Stable  CODE STATUS: Full  Diet recommendation: Heart Healthy  Brief/Interim Summary: As per H&P written by Dr. [name] on 07/30/2020 Thomas Halsted Pyrtleis a 49 y.o.malewith medical history significant forHIV and tobacco abuse. Patient presented to the ED with complaints of increasing difficulty breathing since was discharged from the hospital 3 days ago. He reports he was discharged on 3 L, and he has been increasing his O2,now up to 6 to 8 L. He denies chest pain. He reports compliance with his medications prednisone, and remdesivir. He has 1 more dose of remdesivir to receive as an outpatient. Due to increasing O2 requirements, patient and his wife are concerned, so patient came to the ED.  ED Course:Temperature 99.2. Heart rate 80s to 100.Blood pressure systolic [id number]. O2 sats 83% on 6 L, placed on high flow nasal cannula 13 L, sats 91 to 95%.. Unremarkable CBC BMP. D-dimer 0.46. CTA chest negative for PE, shows progression of bilateral multifocal COVID-pneumonia.  -Patient had treatment for acute hypoxemic respiratory failure in setting of COVID-19 pneumonia with IV steroids and baricitinib per ID recommendations.  He is doing quite well and has been weaned to 4 L nasal cannula oxygen and states that he is feeling well enough for discharge.  He denies any further shortness of breath and is eager to go home.  He will need to  remain on further steroids as prescribed.  No other acute events have been noted throughout the course of this admission.  Discharge Diagnoses:  Principal Problem:   Acute respiratory failure due to COVID-19 [facility]) Active Problems:   Human immunodeficiency virus (HIV) disease ([place])   TOBACCO ABUSE, HX OF   Hypertension  Principal discharge diagnosis: Acute hypoxemic respiratory failure secondary to COVID-19 pneumonia.  Discharge Instructions  Discharge Instructions    Diet - low sodium heart healthy   Complete by: As directed    Increase activity slowly   Complete by: As directed      Allergies as of 08/03/2020      Reactions   Codeine    REACTION: makes pt very sleepy      Medication List    TAKE these medications   acetaminophen 500 MG tablet Commonly known as: TYLENOL Take 1,000 mg by mouth every 6 (six) hours as needed.   albuterol 108 (90 Base) MCG/ACT inhaler Commonly known as: VENTOLIN HFA Inhale 2 puffs into the lungs every 4 (four) hours as needed for wheezing or shortness of breath.   ascorbic acid 500 MG tablet Commonly known as: VITAMIN C Take 1 tablet (500 mg total) by mouth daily.   Biktarvy 50-200-25 MG Tabs tablet Generic drug: bictegravir-emtricitabine-tenofovir AF Take 1 tablet by mouth daily.   cetirizine 10 MG tablet Commonly known as: ZYRTEC Take 10 mg by mouth daily.   guaiFENesin-dextromethorphan 100-10 MG/5ML syrup Commonly known as: ROBITUSSIN DM Take 10 mLs by mouth every 4 (four) hours as needed for cough.   predniSONE 10 MG tablet Commonly known as: DELTASONE Take 5 tablets (50  mg total) by mouth daily for 7 days. What changed:   medication strength  when to take this   sodium chloride 0.65 % Soln nasal spray Commonly known as: OCEAN Place 1 spray into both nostrils as needed for congestion.   Vitamin D3 50 MCG (2000 UT) Tabs Take 1 tablet by mouth daily.   zinc sulfate 220 (50 Zn) MG capsule Take 1 capsule (220 mg  total) by mouth daily.       Follow-up Information    [name], MD Follow up in 1 week(s).   Specialty: Family Medicine Contact information: [address] [place] [address] [phone]              Allergies  Allergen Reactions  . Codeine     REACTION: makes pt very sleepy    Consultations:  None   Procedures/Studies: CT Angio Chest PE W and/or Wo Contrast  Result Date: 07/30/2020 CLINICAL DATA:  COVID-19 diagnosed 2 weeks ago, chest pain, hypoxia, short of breath EXAM: CT ANGIOGRAPHY CHEST WITH CONTRAST TECHNIQUE: Multidetector CT imaging of the chest was performed using the standard protocol during bolus administration of intravenous contrast. Multiplanar CT image reconstructions and MIPs were obtained to evaluate the vascular anatomy. CONTRAST:  53mL OMNIPAQUE IOHEXOL 350 MG/ML SOLN COMPARISON:  07/26/2020 FINDINGS: Cardiovascular: This is a technically adequate evaluation of the pulmonary vasculature. No filling defects or pulmonary emboli. The heart is unremarkable without pericardial effusion. No evidence of thoracic aortic aneurysm or dissection. Mediastinum/Nodes: No enlarged mediastinal, hilar, or axillary lymph nodes. Thyroid gland, trachea, and esophagus demonstrate no significant findings. Lungs/Pleura: There is multifocal bilateral airspace disease, more prominent than previous chest x-ray. No effusion or pneumothorax. Central airways are widely patent. Upper Abdomen: No acute abnormality. 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 Musculoskeletal: No acute or destructive bony lesions. Reconstructed images demonstrate no additional findings. Review of the MIP images confirms the above findings. IMPRESSION: 1. No evidence of pulmonary embolus. 2. Progressive multifocal bilateral COVID-19 pneumonia. 3. 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 Electronically Signed   By: Randa Ngo M.D.   On: 07/30/2020 19:18   DG Chest Port 1 View  Result Date: 07/26/2020 CLINICAL DATA:  COVID positive EXAM: PORTABLE CHEST 1 VIEW COMPARISON:  None. FINDINGS: Normal cardiac silhouette. There is patchy bilateral airspace disease. No pleural fluid. No pneumothorax. No acute osseous abnormality. IMPRESSION: Patchy bilateral airspace disease consistent with moderate severity COVID viral pneumonia. Electronically Signed   By: Suzy Bouchard M.D.   On: 07/26/2020 10:47     Discharge Exam: Vitals:   08/03/20 0620 08/03/20 1400  BP: (!) 131/93 138/90  Pulse: 87 92  Resp: 17 18  Temp: 98.4 F (36.9 C) 98.4 F (36.9 C)  SpO2: 95% 94%   Vitals:   08/02/20 1945 08/02/20 2016 08/03/20 0620 08/03/20 1400  BP:  (!) 146/79 (!) 131/93 138/90  Pulse:  79 87 92  Resp:   17 18  Temp:  98.1 F (36.7 C) 98.4 F (36.9 C) 98.4 F (36.9 C)  TempSrc:   Oral Oral  SpO2: (!) 89% 95% 95% 94%  Weight:      Height:        General: Pt is alert, awake, not in acute distress Cardiovascular: RRR, S1/S2 +, no rubs, no gallops Respiratory: CTA bilaterally, no wheezing, no rhonchi, on 4 L nasal cannula oxygen Abdominal: Soft, NT, ND, bowel sounds + Extremities: no edema, no cyanosis  The results of significant diagnostics from this hospitalization (including imaging, microbiology, ancillary and laboratory) are listed below for reference.     Microbiology: Recent Results (from the past 240 hour(s))  Blood Culture (routine x 2)     Status: None   Collection Time: 07/26/20 11:35 AM   Specimen: BLOOD  Result Value Ref Range Status   Specimen Description BLOOD LEFT ANTECUBITAL  Final   Special Requests BOTTLES DRAWN AEROBIC AND ANAEROBIC  Final   Culture   Final    NO GROWTH 5 DAYS Performed at [facility], [address] [place], [place] [address]    Report Status 07/31/2020 FINAL  Final  Blood Culture (routine x 2)     Status: None   Collection Time: 07/26/20 12:30 PM   Specimen:  BLOOD  Result Value Ref Range Status   Specimen Description BLOOD LEFT HAND  Final   Special Requests   Final    BOTTLES DRAWN AEROBIC AND ANAEROBIC Blood Culture adequate volume   Culture   Final    NO GROWTH 5 DAYS Performed at [facility], [address] [place],  [address]    Report Status 07/31/2020 FINAL  Final  MRSA PCR Screening     Status: None   Collection Time: 07/31/20  3:47 AM   Specimen: Nasal Mucosa; Nasopharyngeal  Result Value Ref Range Status   MRSA by PCR NEGATIVE NEGATIVE Final    Comment:        The GeneXpert MRSA Assay (FDA approved for NASAL specimens only), is one component of a comprehensive MRSA colonization surveillance program. It is not intended to diagnose MRSA infection nor to guide or monitor treatment for MRSA infections. Performed at [facility], [address] [place],  [address]      Labs: BNP (last 3 results) No results for input(s): BNP in the last 8760 hours. Basic Metabolic Panel: Recent Labs  Lab 07/30/20 1730 07/31/20 0816 08/01/20 0530 08/02/20 0526 08/03/20 0635  NA 136 137 138 140 138  K 3.9 4.1 4.0 3.8 3.9  CL 98 99 100 101 103  CO2 28 25 27 27 25   GLUCOSE 115* 128* 130* 134* 119*  BUN 19 22* 27* 32* 27*  CREATININE 1.10 0.98 1.04 1.07 1.01  CALCIUM 9.0 8.7* 8.8* 8.6* 8.7*  MG 2.3 2.4 2.5* 2.6* 2.4  PHOS  --  3.3 4.2 5.0* 4.2   Liver Function Tests: Recent Labs  Lab 07/31/20 0816 08/01/20 0530 08/02/20 0526 08/03/20 0635  AST 42* 37 42* 29  ALT 74* 71* 82* 67*  ALKPHOS 49 47 57 55  BILITOT 0.6 0.6 0.6 0.5  PROT 6.6 6.6 6.2* 6.2*  ALBUMIN 3.1* 3.0* 2.9* 2.8*   No results for input(s): LIPASE, AMYLASE in the last 168 hours. No results for input(s): AMMONIA in the last 168 hours. CBC: Recent Labs  Lab 07/30/20 1730 07/31/20 0816 08/01/20 0530 08/02/20 0526 08/03/20 0635  WBC 8.6 8.4 11.4* 13.9* 12.2*  NEUTROABS 7.8* 8.1* 10.3* 12.8* 11.0*  HGB 16.0 15.4 15.4 15.7 15.6  HCT 48.8  47.2 47.1 48.8 48.4  MCV 88.9 89.4 88.5 89.5 89.3  PLT 293 312 444* 458* 467*   Cardiac Enzymes: No results for input(s): CKTOTAL, CKMB, CKMBINDEX, TROPONINI in the last 168 hours. BNP: Invalid input(s): POCBNP CBG: No results for input(s): GLUCAP in the last 168 hours. D-Dimer Recent Labs    08/02/20 0526 08/03/20 0635  DDIMER 0.30 0.46   Hgb A1c Recent Labs    08/01/20  0530  HGBA1C 6.0*   Lipid Profile No results for input(s): CHOL, HDL, LDLCALC, TRIG, CHOLHDL, LDLDIRECT in the last 72 hours. Thyroid function studies No results for input(s): TSH, T4TOTAL, T3FREE, THYROIDAB in the last 72 hours.  Invalid input(s): FREET3 Anemia work up Recent Labs    08/02/20 0526 08/03/20 0635  FERRITIN 556* 547*   Urinalysis    Component Value Date/Time   COLORURINE YELLOW 11/15/2009 [place] 11/15/2009 1821   LABSPEC 1.030 11/15/2009 1821   PHURINE 7.0 11/15/2009 1821   GLUCOSEU 100 (A) 11/15/2009 1821   BILIRUBINUR NEG 11/15/2009 1821   KETONESUR NEG mg/dL 11/15/2009 1821   PROTEINUR NEG mg/dL 11/15/2009 1821   UROBILINOGEN 0.2 11/15/2009 1821   NITRITE NEG 11/15/2009 1821   LEUKOCYTESUR NEG 11/15/2009 1821   Sepsis Labs Invalid input(s): PROCALCITONIN,  WBC,  LACTICIDVEN Microbiology Recent Results (from the past 240 hour(s))  Blood Culture (routine x 2)     Status: None   Collection Time: 07/26/20 11:35 AM   Specimen: BLOOD  Result Value Ref Range Status   Specimen Description BLOOD LEFT ANTECUBITAL  Final   Special Requests BOTTLES DRAWN AEROBIC AND ANAEROBIC  Final   Culture   Final    NO GROWTH 5 DAYS Performed at [facility], [address], [place], [place] [address]    Report Status 07/31/2020 FINAL  Final  Blood Culture (routine x 2)     Status: None   Collection Time: 07/26/20 12:30 PM   Specimen: BLOOD  Result Value Ref Range Status   Specimen Description BLOOD LEFT HAND  Final   Special Requests   Final    BOTTLES DRAWN AEROBIC  AND ANAEROBIC Blood Culture adequate volume   Culture   Final    NO GROWTH 5 DAYS Performed at [facility], [address], [place], [place] [address]    Report Status 07/31/2020 FINAL  Final  MRSA PCR Screening     Status: None   Collection Time: 07/31/20  3:47 AM   Specimen: Nasal Mucosa; Nasopharyngeal  Result Value Ref Range Status   MRSA by PCR NEGATIVE NEGATIVE Final    Comment:        The GeneXpert MRSA Assay (FDA approved for NASAL specimens only), is one component of a comprehensive MRSA colonization surveillance program. It is not intended to diagnose MRSA infection nor to guide or monitor treatment for MRSA infections. Performed at [facility], [address], [place], [place] [address]      Time coordinating discharge: 35 minutes  SIGNED:   Rodena Goldmann, DO [facility] 08/03/2020, 3:12 PM  If 7PM-7AM, please contact night-coverage [email or web address]

## 2020-08-03 NOTE — Progress Notes (Signed)
Pt's SaO2 94% on 4 lpm [place], HR 90 and RR 18 at rest. Pt ambulated 96 ft in room. HR up to 136, RR up to 20,  SaO2 down to 86% while ambulating. Pt denies SOB, no resp distress/dyspnea  Noted. Pt back to bed. After 2 minutes of rest, HR down to 109, RR 20, SaO2 back up to 92%. Pt states, "I feel good".

## 2020-08-08 DIAGNOSIS — Z6834 Body mass index (BMI) 34.0-34.9, adult: Secondary | ICD-10-CM | POA: Diagnosis not present

## 2020-08-08 DIAGNOSIS — U071 COVID-19: Secondary | ICD-10-CM | POA: Diagnosis not present

## 2020-08-08 DIAGNOSIS — E6609 Other obesity due to excess calories: Secondary | ICD-10-CM | POA: Diagnosis not present

## 2020-08-08 DIAGNOSIS — Z1331 Encounter for screening for depression: Secondary | ICD-10-CM | POA: Diagnosis not present

## 2020-08-16 NOTE — Addendum Note (Signed)
Encounter addended by: Paul Dykes, RN on: 08/16/2020 5:31 PM  Actions taken: Charge Capture section accepted

## 2021-03-21 DIAGNOSIS — Z6841 Body Mass Index (BMI) 40.0 and over, adult: Secondary | ICD-10-CM | POA: Diagnosis not present

## 2021-03-21 DIAGNOSIS — Z1389 Encounter for screening for other disorder: Secondary | ICD-10-CM | POA: Diagnosis not present

## 2021-03-21 DIAGNOSIS — Z21 Asymptomatic human immunodeficiency virus [HIV] infection status: Secondary | ICD-10-CM | POA: Diagnosis not present

## 2021-03-21 DIAGNOSIS — E6609 Other obesity due to excess calories: Secondary | ICD-10-CM | POA: Diagnosis not present

## 2021-03-21 DIAGNOSIS — E782 Mixed hyperlipidemia: Secondary | ICD-10-CM | POA: Diagnosis not present

## 2021-03-21 DIAGNOSIS — R7309 Other abnormal glucose: Secondary | ICD-10-CM | POA: Diagnosis not present

## 2021-03-21 DIAGNOSIS — Z0001 Encounter for general adult medical examination with abnormal findings: Secondary | ICD-10-CM | POA: Diagnosis not present

## 2021-04-12 ENCOUNTER — Encounter: Payer: Self-pay | Admitting: Internal Medicine

## 2021-05-28 IMAGING — CT CT ANGIO CHEST
2 of 6 series · 18 of 46 positions shown · IV contrast (Omnipaque or Isovue)
Comparison: 07/26/2020

CLINICAL DATA: D31IX-7Z diagnosed 2 weeks ago, chest pain, hypoxia,
short of breath

EXAM:
CT ANGIOGRAPHY CHEST WITH CONTRAST
TECHNIQUE: Multidetector CT imaging of the chest was performed using the
standard protocol during bolus administration of intravenous
contrast. Multiplanar CT image reconstructions and MIPs were
obtained to evaluate the vascular anatomy.
CONTRAST:  80mL OMNIPAQUE IOHEXOL 350 MG/ML SOLN

[Series 5: pe axial thins · axial · 0.83mm/px · z∈[+1068,+1329]mm · 15 of 287 slices shown]
[im 13/287  lung]
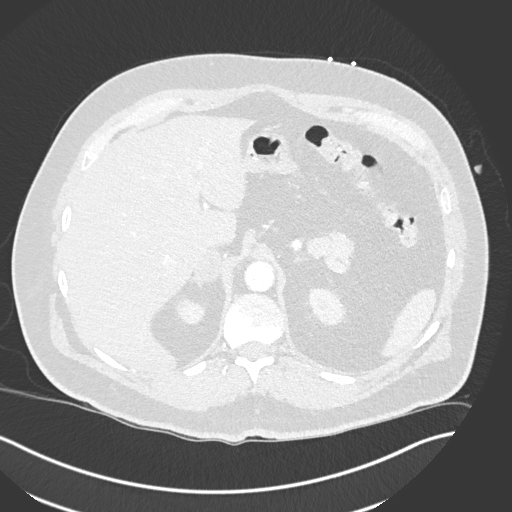
[im 38/287  soft-tissue]
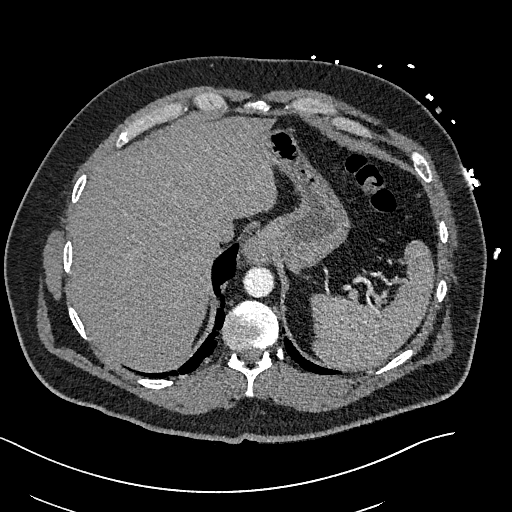
[im 50/287  lung]
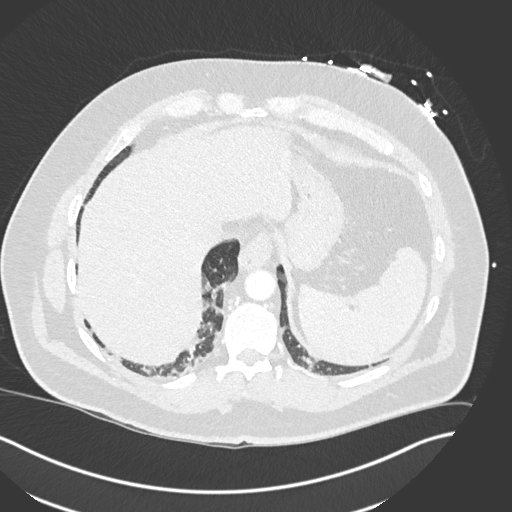
[im 75/287  soft-tissue]
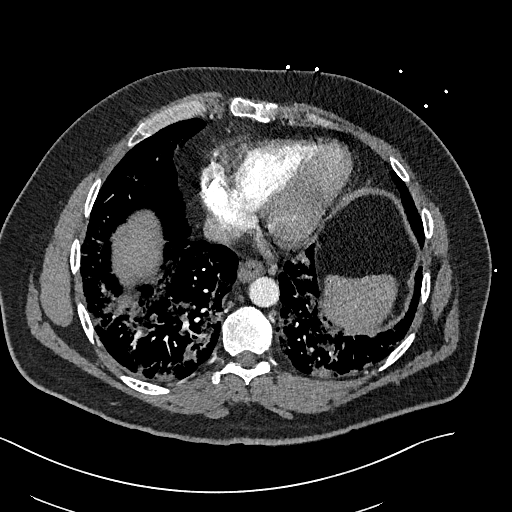
[im 88/287  lung]
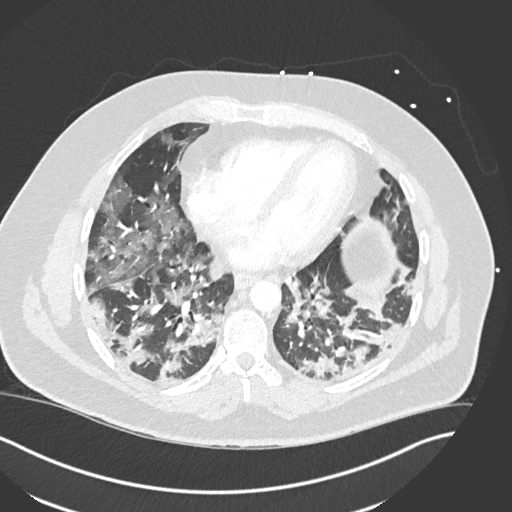
[im 112/287  soft-tissue]
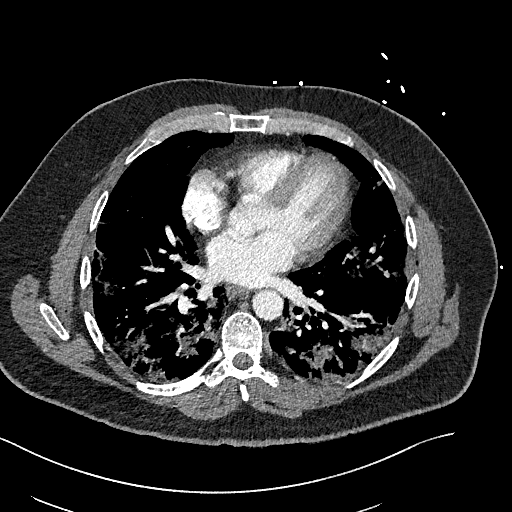
[im 125/287  lung]
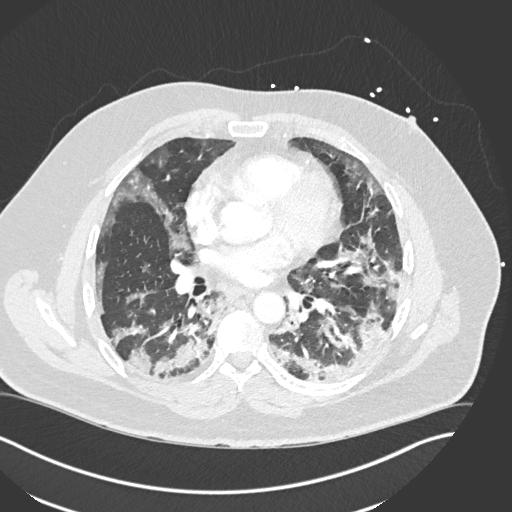
[im 150/287  soft-tissue]
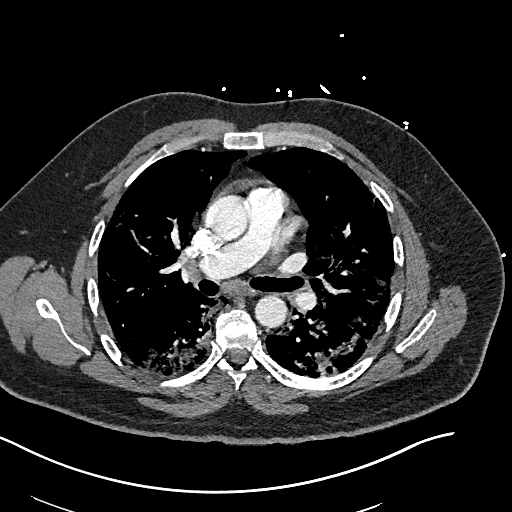
[im 162/287  lung]
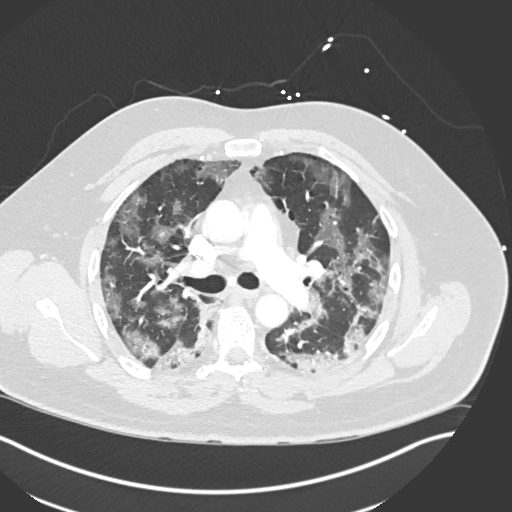
[im 175/287  soft-tissue]
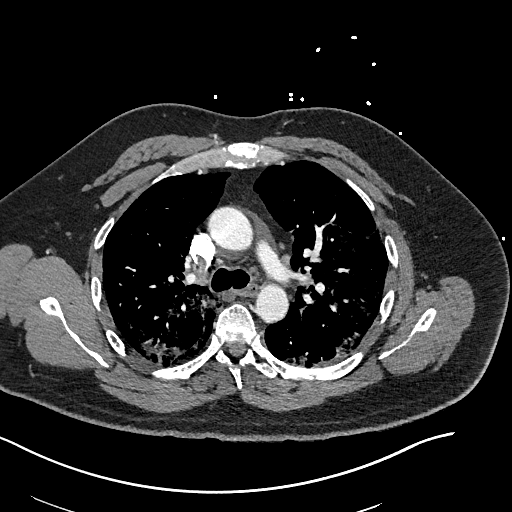
[im 199/287  lung]
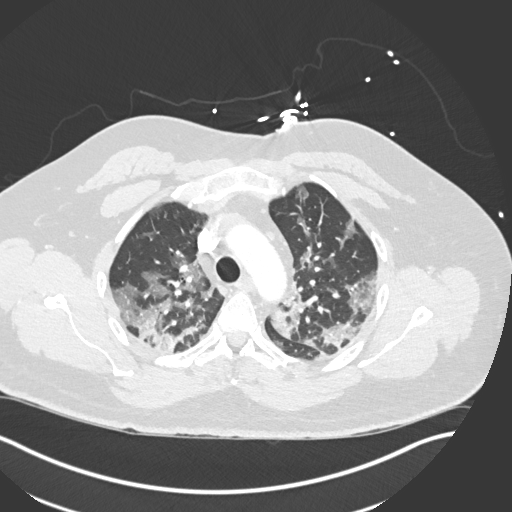
[im 212/287  soft-tissue]
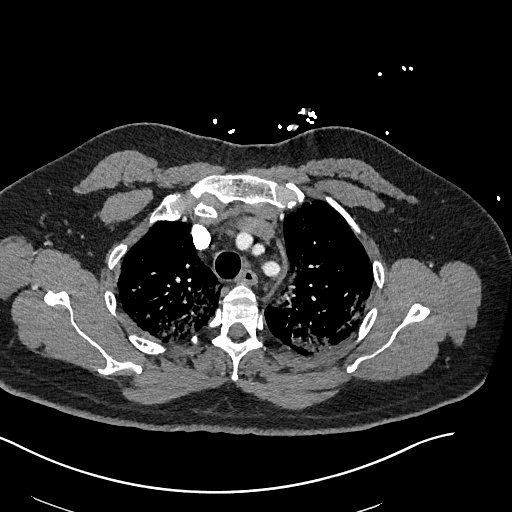
[im 237/287  lung]
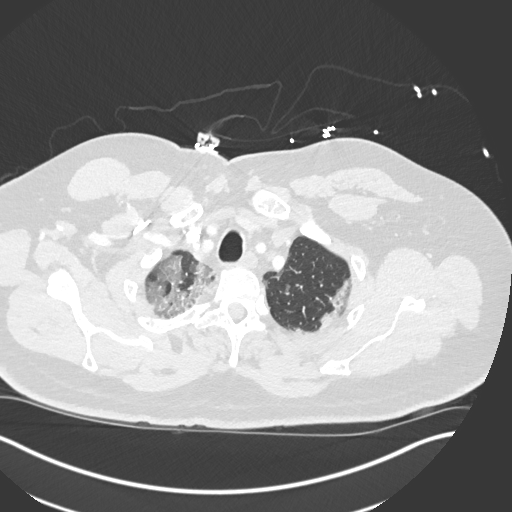
[im 249/287  soft-tissue]
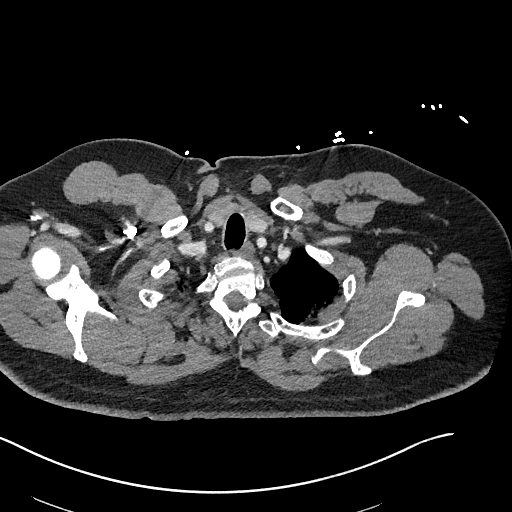
[im 274/287  lung]
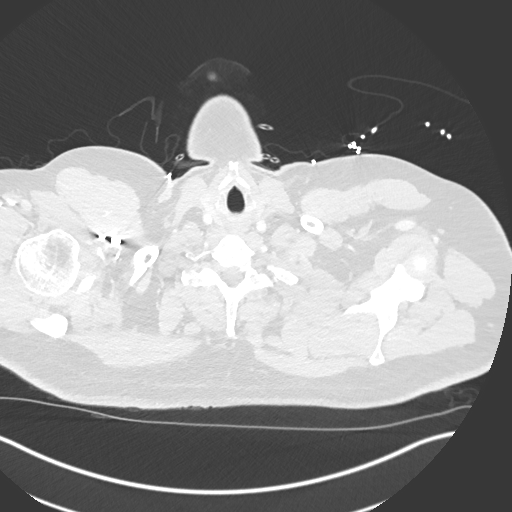

[Series 7: cor soft · coronal · 0.58mm/px · 3 of 171 slices shown]
[im 43/171  soft-tissue]
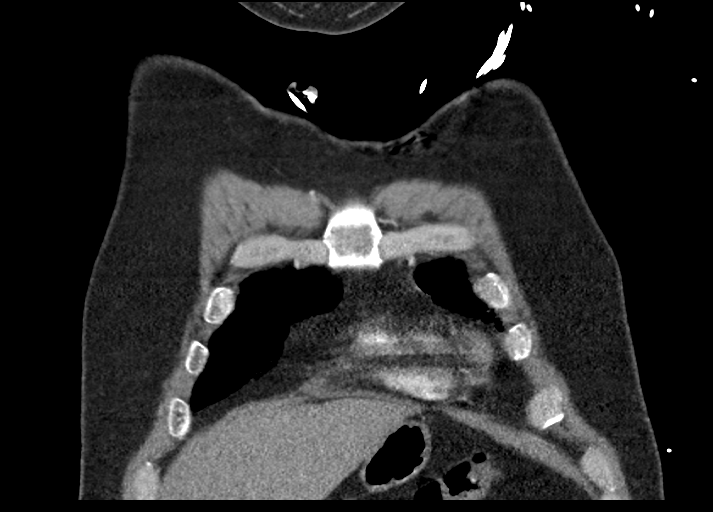
[im 86/171  soft-tissue]
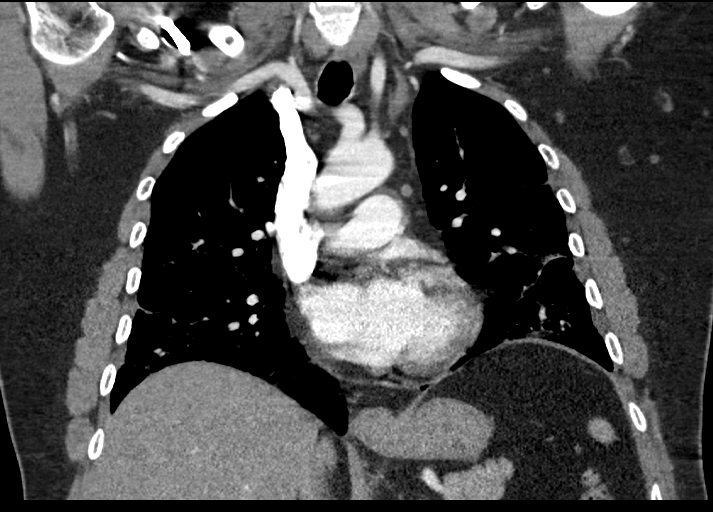
[im 128/171  soft-tissue]
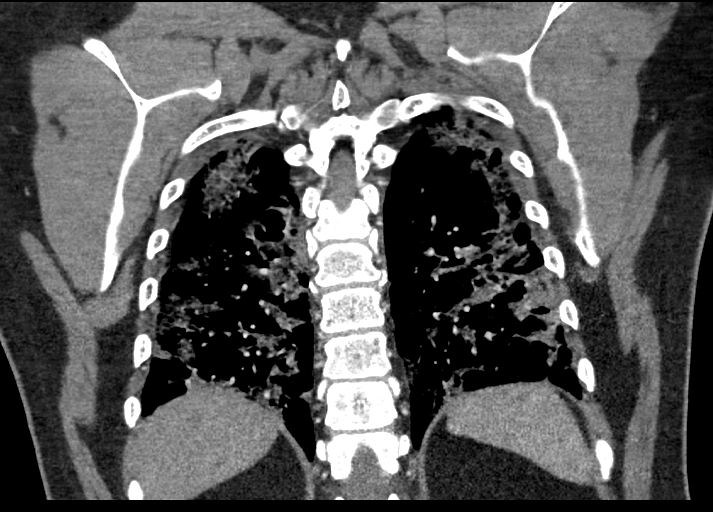

[18 of 46 positions shown; findings below may reference images not displayed]

FINDINGS: Cardiovascular: This is a technically adequate evaluation of the
pulmonary vasculature. No filling defects or pulmonary emboli.

The heart is unremarkable without pericardial effusion. No evidence
of thoracic aortic aneurysm or dissection.

Mediastinum/Nodes: No enlarged mediastinal, hilar, or axillary lymph
nodes. Thyroid gland, trachea, and esophagus demonstrate no
significant findings.

Lungs/Pleura: There is multifocal bilateral airspace disease, more
prominent than previous chest x-ray. No effusion or pneumothorax.
Central airways are widely patent.

Upper Abdomen: No acute abnormality. Indeterminate 2.4 cm right
adrenal mass statistically likely reflects an adenoma.

Musculoskeletal: No acute or destructive bony lesions. Reconstructed
images demonstrate no additional findings.

Review of the MIP images confirms the above findings.
IMPRESSION: 1. No evidence of pulmonary embolus.
2. Progressive multifocal bilateral D31IX-7Z pneumonia.
3. Indeterminate 2.4 cm right adrenal mass, statistically likely
reflects an adenoma.

## 2021-06-12 ENCOUNTER — Other Ambulatory Visit: Payer: BLUE CROSS/BLUE SHIELD

## 2021-06-12 ENCOUNTER — Other Ambulatory Visit: Payer: Self-pay

## 2021-06-12 DIAGNOSIS — B20 Human immunodeficiency virus [HIV] disease: Secondary | ICD-10-CM

## 2021-06-13 LAB — T-HELPER CELL (CD4) - (RCID CLINIC ONLY)
CD4 % Helper T Cell: 55 % (ref 33–65)
CD4 T Cell Abs: 720 /uL (ref 400–1790)

## 2021-06-14 LAB — HIV-1 RNA QUANT-NO REFLEX-BLD
HIV 1 RNA Quant: NOT DETECTED Copies/mL
HIV-1 RNA Quant, Log: NOT DETECTED Log cps/mL

## 2021-06-14 LAB — CBC WITH DIFFERENTIAL/PLATELET
Absolute Monocytes: 381 cells/uL (ref 200–950)
Basophils Absolute: 28 cells/uL (ref 0–200)
Basophils Relative: 0.5 %
Eosinophils Absolute: 129 cells/uL (ref 15–500)
Eosinophils Relative: 2.3 %
HCT: 47.8 % (ref 38.5–50.0)
Hemoglobin: 16.4 g/dL (ref 13.2–17.1)
Lymphs Abs: 1400 cells/uL (ref 850–3900)
MCH: 30.7 pg (ref 27.0–33.0)
MCHC: 34.3 g/dL (ref 32.0–36.0)
MCV: 89.5 fL (ref 80.0–100.0)
MPV: 9.9 fL (ref 7.5–12.5)
Monocytes Relative: 6.8 %
Neutro Abs: 3662 cells/uL (ref 1500–7800)
Neutrophils Relative %: 65.4 %
Platelets: 281 10*3/uL (ref 140–400)
RBC: 5.34 10*6/uL (ref 4.20–5.80)
RDW: 14 % (ref 11.0–15.0)
Total Lymphocyte: 25 %
WBC: 5.6 10*3/uL (ref 3.8–10.8)

## 2021-06-14 LAB — COMPLETE METABOLIC PANEL WITH GFR
AG Ratio: 1.9 (calc) (ref 1.0–2.5)
ALT: 16 U/L (ref 9–46)
AST: 13 U/L (ref 10–40)
Albumin: 4.5 g/dL (ref 3.6–5.1)
Alkaline phosphatase (APISO): 58 U/L (ref 36–130)
BUN: 17 mg/dL (ref 7–25)
CO2: 29 mmol/L (ref 20–32)
Calcium: 10 mg/dL (ref 8.6–10.3)
Chloride: 103 mmol/L (ref 98–110)
Creat: 1.22 mg/dL (ref 0.60–1.29)
Globulin: 2.4 g/dL (calc) (ref 1.9–3.7)
Glucose, Bld: 79 mg/dL (ref 65–99)
Potassium: 4.7 mmol/L (ref 3.5–5.3)
Sodium: 139 mmol/L (ref 135–146)
Total Bilirubin: 0.4 mg/dL (ref 0.2–1.2)
Total Protein: 6.9 g/dL (ref 6.1–8.1)
eGFR: 73 mL/min/{1.73_m2} (ref >=60)

## 2021-06-14 LAB — LIPID PANEL
Cholesterol: 189 mg/dL (ref <200)
HDL: 38 mg/dL — ABNORMAL LOW (ref >=40)
LDL Cholesterol (Calc): 119 mg/dL (calc) — ABNORMAL HIGH
Non-HDL Cholesterol (Calc): 151 mg/dL (calc) — ABNORMAL HIGH (ref <130)
Total CHOL/HDL Ratio: 5 (calc) — ABNORMAL HIGH (ref <5.0)
Triglycerides: 206 mg/dL — ABNORMAL HIGH (ref <150)

## 2021-06-14 LAB — RPR: RPR Ser Ql: NONREACTIVE

## 2021-06-26 ENCOUNTER — Other Ambulatory Visit: Payer: Self-pay

## 2021-06-26 ENCOUNTER — Ambulatory Visit: Payer: BLUE CROSS/BLUE SHIELD | Admitting: Infectious Disease

## 2021-06-26 ENCOUNTER — Encounter: Payer: Self-pay | Admitting: Infectious Disease

## 2021-06-26 VITALS — BP 125/84 | HR 89 | Temp 98.2°F | Ht 74.0 in | Wt 283.0 lb

## 2021-06-26 DIAGNOSIS — I10 Essential (primary) hypertension: Secondary | ICD-10-CM

## 2021-06-26 DIAGNOSIS — Z87891 Personal history of nicotine dependence: Secondary | ICD-10-CM | POA: Diagnosis not present

## 2021-06-26 DIAGNOSIS — B20 Human immunodeficiency virus [HIV] disease: Secondary | ICD-10-CM | POA: Diagnosis not present

## 2021-06-26 DIAGNOSIS — Z6832 Body mass index (BMI) 32.0-32.9, adult: Secondary | ICD-10-CM

## 2021-06-26 DIAGNOSIS — R635 Abnormal weight gain: Secondary | ICD-10-CM

## 2021-06-26 MED ORDER — BIKTARVY 50-200-25 MG PO TABS: 1.0000 | ORAL_TABLET | Freq: Every day | ORAL | 4 refills | Status: DC

## 2021-06-26 NOTE — Progress Notes (Signed)
Subjective:  Chief complaint follow-up for HIV disease on medications  Chief complaitn followup for HIV  Patient ID: Thomas Armstrong, male    DOB: 09-11-1970, 50 y.o.   MRN: [id number]  HPI  50  year-old man doing extremely well on his current antiretroviral regimen of Atripla with an undetectable viral load and healthy CD4 count.  We switched him over to [facility] after we had had genotype archive performed that was itself undoubtedly erroneous see prior notes.  He has continued to maintain perfect virological suppression on Biktarvy.  Continue to put on weight and we did discuss the DO-IT study which I believe he would be eligible for.  He was interested though he had some concerned about how much time would be involved.              Past Medical History:  Diagnosis Date   HIV infection ([place])    HTN, white coat 02/21/2016   Hypertension    Hypertension 04/30/2017    No past surgical history on file.  Family History  Problem Relation Age of Onset   Stroke Mother    Stroke Father    Heart disease Paternal Grandmother    Stroke Paternal Grandmother       Social History   Socioeconomic History   Marital status: Married    Spouse name: Not on file   Number of children: Not on file   Years of education: Not on file   Highest education level: Not on file  Occupational History   Not on file  Tobacco Use   Smoking status: Former    Types: Cigarettes    Quit date: 2020    Years since quitting: 2.9   Smokeless tobacco: Never  Vaping Use   Vaping Use: Some days   Substances: Nicotine, Flavoring  Substance and Sexual Activity   Alcohol use: Yes    Alcohol/week: 0.0 standard drinks    Comment: rarely   Drug use: No   Sexual activity: Yes    Comment: declined condoms  Other Topics Concern   Not on file  Social History Narrative   Not on file   Social Determinants of Health   Financial Resource Strain: Not on file  Food Insecurity: Not on file   Transportation Needs: Not on file  Physical Activity: Not on file  Stress: Not on file  Social Connections: Not on file    Allergies  Allergen Reactions   Codeine     REACTION: makes pt very sleepy     Current Outpatient Medications:    acetaminophen (TYLENOL) 500 MG tablet, Take 1,000 mg by mouth every 6 (six) hours as needed., Disp: , Rfl:    bictegravir-emtricitabine-tenofovir AF (BIKTARVY) 50-200-25 MG TABS tablet, Take 1 tablet by mouth daily., Disp: 90 tablet, Rfl: 4   cetirizine (ZYRTEC) 10 MG tablet, Take 10 mg by mouth daily., Disp: , Rfl:    Cholecalciferol (VITAMIN D3) 50 MCG (2000 UT) TABS, Take 1 tablet by mouth daily., Disp: , Rfl:    lisinopril (ZESTRIL) 10 MG tablet, Take 10 mg by mouth daily., Disp: , Rfl:    zinc sulfate 220 (50 Zn) MG capsule, Take 1 capsule (220 mg total) by mouth daily., Disp: 30 capsule, Rfl: 0   albuterol (VENTOLIN HFA) 108 (90 Base) MCG/ACT inhaler, Inhale 2 puffs into the lungs every 4 (four) hours as needed for wheezing or shortness of breath. (Patient not taking: Reported on 06/26/2021), Disp: 8 g, Rfl: 1   ascorbic acid (  VITAMIN C) 500 MG tablet, Take 1 tablet (500 mg total) by mouth daily. (Patient not taking: Reported on 06/26/2021), Disp: 30 tablet, Rfl: 0   guaiFENesin-dextromethorphan (ROBITUSSIN DM) 100-10 MG/5ML syrup, Take 10 mLs by mouth every 4 (four) hours as needed for cough. (Patient not taking: Reported on 06/26/2021), Disp: 118 mL, Rfl: 0   lisinopril (ZESTRIL) 20 MG tablet, Take 1 tablet by mouth daily. (Patient not taking: Reported on 06/26/2021), Disp: , Rfl:    sodium chloride (OCEAN) 0.65 % SOLN nasal spray, Place 1 spray into both nostrils as needed for congestion. (Patient not taking: Reported on 06/26/2021), Disp: 60 mL, Rfl: 2   Turmeric 500 MG CAPS, See admin instructions., Disp: , Rfl:       Review of Systems  Constitutional:  Negative for activity change, appetite change, chills, diaphoresis, fatigue, fever and  unexpected weight change.  HENT:  Negative for congestion, rhinorrhea, sinus pressure, sneezing, sore throat and trouble swallowing.   Eyes:  Negative for photophobia and visual disturbance.  Respiratory:  Negative for cough, chest tightness, shortness of breath, wheezing and stridor.   Cardiovascular:  Negative for chest pain, palpitations and leg swelling.  Gastrointestinal:  Negative for abdominal distention, abdominal pain, anal bleeding, blood in stool, constipation, diarrhea, nausea and vomiting.  Genitourinary:  Negative for difficulty urinating, dysuria, flank pain and hematuria.  Musculoskeletal:  Negative for arthralgias, back pain, gait problem, joint swelling and myalgias.  Skin:  Negative for color change, pallor, rash and wound.  Neurological:  Negative for dizziness, tremors, weakness and light-headedness.  Hematological:  Negative for adenopathy. Does not bruise/bleed easily.  Psychiatric/Behavioral:  Negative for agitation, behavioral problems, confusion, decreased concentration, dysphoric mood and sleep disturbance.          Objective:   Physical Exam Constitutional:      Appearance: He is well-developed.  HENT:     Head: Normocephalic and atraumatic.  Eyes:     Conjunctiva/sclera: Conjunctivae normal.  Cardiovascular:     Rate and Rhythm: Normal rate and regular rhythm.  Pulmonary:     Effort: Pulmonary effort is normal. No respiratory distress.     Breath sounds: No wheezing.  Abdominal:     General: There is no distension.     Palpations: Abdomen is soft.  Musculoskeletal:        General: No tenderness. Normal range of motion.     Cervical back: Normal range of motion and neck supple.  Skin:    General: Skin is warm and dry.     Coloration: Skin is not pale.     Findings: No erythema or rash.  Neurological:     General: No focal deficit present.     Mental Status: He is alert and oriented to person, place, and time.  Psychiatric:        Mood and  Affect: Mood normal.        Behavior: Behavior normal.        Thought Content: Thought content normal.        Judgment: Judgment normal.          Assessment & Plan:  HIV disease: I have reviewed his most recent viral load from December which is not detected Lab Results  Component Value Date   HIV1RNAQUANT Not Detected 06/12/2021   I have reviewed his most recent CD4 count from same date which is 720  Lab Results  Component Value Date   CD4TABS 720 06/12/2021   CD4TABS 766 06/13/2020   CD4TABS  697 06/22/2019   Continue his [place] for now but I think he would be a great candidate for the DO-IT study and I am hopeful we could enroll him into this landmark study on weight gain  Hypertension: He is continuing on his lisinopril hyperlipidemia Vitals:   06/26/21 0923  BP: 125/84  Pulse: 89  Temp: 98.2 F (36.8 C)  SpO2: 97%   Seasonal allergies on cetirizine  Hyperlipidemia: He does not yet meet criteria for statin based on traditional guidelines   Lipid Panel     Component Value Date/Time   CHOL 189 06/12/2021 0932   TRIG 206 (H) 06/12/2021 0932   HDL 38 (L) 06/12/2021 0932   CHOLHDL 5.0 (H) 06/12/2021 0932   VLDL 58 (H) 02/07/2016 1346   LDLCALC 119 (H) 06/12/2021 0932   Weight gain after switch to INSTI now with obesity: Would like to enroll him in the study and also engage in efforts to help him lose weight again

## 2021-07-03 DIAGNOSIS — J0100 Acute maxillary sinusitis, unspecified: Secondary | ICD-10-CM | POA: Diagnosis not present

## 2021-07-05 ENCOUNTER — Other Ambulatory Visit: Payer: Self-pay

## 2021-07-05 ENCOUNTER — Encounter (INDEPENDENT_AMBULATORY_CARE_PROVIDER_SITE_OTHER): Payer: BLUE CROSS/BLUE SHIELD | Admitting: *Deleted

## 2021-07-05 VITALS — BP 134/91 | HR 81 | Temp 98.4°F | Ht 73.0 in | Wt 280.0 lb

## 2021-07-05 DIAGNOSIS — Z006 Encounter for examination for normal comparison and control in clinical research program: Secondary | ICD-10-CM

## 2021-07-05 NOTE — Addendum Note (Signed)
Addended by: Bobbie Stack on: 07/05/2021 10:51 AM   Modules accepted: Orders

## 2021-07-05 NOTE — Research (Signed)
Thomas Armstrong was here to screen for the Do-It study, [phone]. He had discussed the study with Dr. Tommy Medal and reviewed the consent before the visit. Informed consent was obtained after the study was discussed in detail. He understands he may be assigned to one of 3 treatment arms (stay on what he's on currently-Biktarvy or be switched to Doravirine with Truvada or Doravirine with Descovy. He has a residual cough now from getting over a sinus infection that started before Christmas. He was treated with augmentin and should be finished up with it soon. His BMI is 37.1 today. He had started [facility] in October of 2018 and had been on Atripla prior to that, as well as a few other meds. He was diagnosed in 2001.Entry is planned for 07/24/20 as long as his labs are WNL.

## 2021-07-11 LAB — COMPREHENSIVE METABOLIC PANEL
AG Ratio: 1.7 (calc) (ref 1.0–2.5)
ALT: 16 U/L (ref 9–46)
AST: 15 U/L (ref 10–40)
Albumin: 4.4 g/dL (ref 3.6–5.1)
Alkaline phosphatase (APISO): 62 U/L (ref 36–130)
BUN: 15 mg/dL (ref 7–25)
CO2: 28 mmol/L (ref 20–32)
Calcium: 9.8 mg/dL (ref 8.6–10.3)
Chloride: 102 mmol/L (ref 98–110)
Creat: 1.26 mg/dL (ref 0.60–1.29)
Globulin: 2.6 g/dL (calc) (ref 1.9–3.7)
Glucose, Bld: 84 mg/dL (ref 65–99)
Potassium: 4.5 mmol/L (ref 3.5–5.3)
Sodium: 139 mmol/L (ref 135–146)
Total Bilirubin: 0.4 mg/dL (ref 0.2–1.2)
Total Protein: 7 g/dL (ref 6.1–8.1)

## 2021-07-11 LAB — CBC WITH DIFFERENTIAL/PLATELET
Absolute Monocytes: 429 cells/uL (ref 200–950)
Basophils Absolute: 39 cells/uL (ref 0–200)
Basophils Relative: 0.6 %
Eosinophils Absolute: 150 cells/uL (ref 15–500)
Eosinophils Relative: 2.3 %
HCT: 48 % (ref 38.5–50.0)
Hemoglobin: 16.4 g/dL (ref 13.2–17.1)
Lymphs Abs: 1541 cells/uL (ref 850–3900)
MCH: 30.4 pg (ref 27.0–33.0)
MCHC: 34.2 g/dL (ref 32.0–36.0)
MCV: 89.1 fL (ref 80.0–100.0)
MPV: 10.1 fL (ref 7.5–12.5)
Monocytes Relative: 6.6 %
Neutro Abs: 4342 cells/uL (ref 1500–7800)
Neutrophils Relative %: 66.8 %
Platelets: 309 10*3/uL (ref 140–400)
RBC: 5.39 10*6/uL (ref 4.20–5.80)
RDW: 14 % (ref 11.0–15.0)
Total Lymphocyte: 23.7 %
WBC: 6.5 10*3/uL (ref 3.8–10.8)

## 2021-07-11 LAB — HIV-1 RNA QUANT-NO REFLEX-BLD
HIV 1 RNA Quant: <20 Copies/mL — ABNORMAL HIGH
HIV-1 RNA Quant, Log: <1.3 Log cps/mL — ABNORMAL HIGH

## 2021-07-11 LAB — HIV-1/2 AB - DIFFERENTIATION
HIV-1 antibody: POSITIVE — AB
HIV-2 Ab: NEGATIVE

## 2021-07-11 LAB — HIV ANTIBODY (ROUTINE TESTING W REFLEX): HIV 1&2 Ab, 4th Generation: REACTIVE — AB

## 2021-07-11 LAB — BILIRUBIN, DIRECT: Bilirubin, Direct: 0.1 mg/dL (ref 0.0–0.2)

## 2021-07-11 LAB — PHOSPHORUS: Phosphorus: 4.1 mg/dL (ref 2.5–4.5)

## 2021-08-01 DIAGNOSIS — U071 COVID-19: Secondary | ICD-10-CM | POA: Diagnosis not present

## 2021-08-10 ENCOUNTER — Encounter (INDEPENDENT_AMBULATORY_CARE_PROVIDER_SITE_OTHER): Payer: Self-pay | Admitting: *Deleted

## 2021-08-10 ENCOUNTER — Encounter: Payer: Self-pay | Admitting: Family

## 2021-08-10 ENCOUNTER — Other Ambulatory Visit: Payer: Self-pay

## 2021-08-10 ENCOUNTER — Ambulatory Visit (INDEPENDENT_AMBULATORY_CARE_PROVIDER_SITE_OTHER): Payer: Self-pay | Admitting: Family

## 2021-08-10 ENCOUNTER — Other Ambulatory Visit: Payer: Self-pay | Admitting: *Deleted

## 2021-08-10 VITALS — BP 150/97 | HR 86 | Temp 97.6°F | Wt 278.2 lb

## 2021-08-10 DIAGNOSIS — Z006 Encounter for examination for normal comparison and control in clinical research program: Secondary | ICD-10-CM

## 2021-08-10 LAB — URINALYSIS, ROUTINE W REFLEX MICROSCOPIC
Bacteria, UA: NONE SEEN /HPF
Bilirubin Urine: NEGATIVE
Glucose, UA: NEGATIVE
Hgb urine dipstick: NEGATIVE
Ketones, ur: NEGATIVE
Leukocytes,Ua: NEGATIVE
Nitrite: NEGATIVE
RBC / HPF: NONE SEEN /HPF (ref 0–2)
Specific Gravity, Urine: 1.019 (ref 1.001–1.035)
Squamous Epithelial / HPF: NONE SEEN /HPF (ref <=5)
WBC, UA: NONE SEEN /HPF (ref 0–5)
pH: 5.5 (ref 5.0–8.0)

## 2021-08-10 LAB — COMPREHENSIVE METABOLIC PANEL
AG Ratio: 1.9 (calc) (ref 1.0–2.5)
ALT: 16 U/L (ref 9–46)
AST: 15 U/L (ref 10–40)
Albumin: 4.8 g/dL (ref 3.6–5.1)
Alkaline phosphatase (APISO): 59 U/L (ref 36–130)
BUN: 14 mg/dL (ref 7–25)
CO2: 25 mmol/L (ref 20–32)
Calcium: 9.9 mg/dL (ref 8.6–10.3)
Chloride: 101 mmol/L (ref 98–110)
Creat: 1.25 mg/dL (ref 0.60–1.29)
Globulin: 2.5 g/dL (calc) (ref 1.9–3.7)
Glucose, Bld: 80 mg/dL (ref 65–99)
Potassium: 4.1 mmol/L (ref 3.5–5.3)
Sodium: 139 mmol/L (ref 135–146)
Total Bilirubin: 0.4 mg/dL (ref 0.2–1.2)
Total Protein: 7.3 g/dL (ref 6.1–8.1)

## 2021-08-10 LAB — LIPID PANEL
Cholesterol: 179 mg/dL (ref <200)
HDL: 36 mg/dL — ABNORMAL LOW (ref >=40)
LDL Cholesterol (Calc): 114 mg/dL (calc) — ABNORMAL HIGH
Non-HDL Cholesterol (Calc): 143 mg/dL (calc) — ABNORMAL HIGH (ref <130)
Total CHOL/HDL Ratio: 5 (calc) — ABNORMAL HIGH (ref <5.0)
Triglycerides: 175 mg/dL — ABNORMAL HIGH (ref <150)

## 2021-08-10 LAB — BILIRUBIN, DIRECT: Bilirubin, Direct: 0.1 mg/dL (ref 0.0–0.2)

## 2021-08-10 LAB — PHOSPHORUS: Phosphorus: 3.5 mg/dL (ref 2.5–4.5)

## 2021-08-10 LAB — MICROSCOPIC MESSAGE

## 2021-08-10 MED ORDER — STUDY - DO-IT A5391 - DORAVIRINE (PIFELTRO) 100MG TABLET (PI-VAN DAM): 100.0000 mg | ORAL_TABLET | Freq: Every day | ORAL | 11 refills | Status: DC

## 2021-08-10 MED ORDER — EMTRICITABINE-TENOFOVIR AF 200-25 MG PO TABS: 1.0000 | ORAL_TABLET | Freq: Every day | ORAL | 1 refills | Status: DC

## 2021-08-10 NOTE — Research (Signed)
Thomas Armstrong was here to enroll in the Do-It Study. After eligiblity was confirmed, he was randomized to receive doravirine and descovy daily, He will be provided the doravirine by the study and a prescription was sent in for the descovy through his pharmacy. As soon as he receives the descovy in the mail, he will start both doravirine and the descovy and stop the biktarvy. He denies any new problems or medications. His BP was a little high. He recently had a visit with his work provider who did recommend him starting lisinopril and fish oil. He consulted with his PCP who said he didn't need to take it, since the lisinopril was giving him bad reflux. We did discuss the need for something for his BP in the future and will have him see Claiborne Billings at the next visit.he will be returning in 4 weeks.

## 2021-08-10 NOTE — Progress Notes (Signed)
Subjective:    Patient ID: Thomas Armstrong, male    DOB: 1971/03/24, 51 y.o.   MRN: [id number]  Chief Complaint  Patient presents with   Research Physical Exam    HPI:  Thomas Armstrong is a 51 y.o. male with HIV disease and obsesity presenting today for physical exam for entry into A5391 Study.Currently on Biktarvy and taking medication as prescribed. He has no Thomas current health concerns.    Allergies  Allergen Reactions   Codeine     REACTION: makes pt very sleepy      Outpatient Medications Prior to Visit  Medication Sig Dispense Refill   acetaminophen (TYLENOL) 500 MG tablet Take 1,000 mg by mouth every 6 (six) hours as needed.     amoxicillin-clavulanate (AUGMENTIN) 875-125 MG tablet Take 1 tablet by mouth 2 (two) times daily.     azelastine (ASTELIN) 0.1 % nasal spray Place 2 sprays into both nostrils 2 (two) times daily.     cetirizine (ZYRTEC ALLERGY) 10 MG tablet Take 10 mg by mouth daily as needed.     cetirizine (ZYRTEC) 10 MG tablet Take 10 mg by mouth daily.     Cholecalciferol (VITAMIN D3) 50 MCG (2000 UT) TABS Take 1 tablet by mouth daily.     lisinopril (ZESTRIL) 20 MG tablet Take 1 tablet by mouth daily. (Patient not taking: Reported on 06/26/2021)     sodium chloride (OCEAN) 0.65 % SOLN nasal spray Place 1 spray into both nostrils as needed for congestion. (Patient not taking: Reported on 06/26/2021) 60 mL 2   zinc sulfate 220 (50 Zn) MG capsule Take 1 capsule (220 mg total) by mouth daily. 30 capsule 0   bictegravir-emtricitabine-tenofovir AF (BIKTARVY) 50-200-25 MG TABS tablet Take 1 tablet by mouth daily. 90 tablet 4   No facility-administered medications prior to visit.     Past Medical History:  Diagnosis Date   HIV infection ([place])    HTN, white coat 02/21/2016   Hypertension    Hypertension 04/30/2017     No past surgical history on file.  Review of Systems  Constitutional: Denies fever, chills, fatigue, or significant weight  gain/loss. HENT: Head: Denies headache or neck pain Ears: Denies changes in hearing, ringing in ears, earache, drainage Nose: Denies discharge, stuffiness, itching, nosebleed, sinus pain Throat: Denies sore throat, hoarseness, dry mouth, sores, thrush Eyes: Denies loss/changes in vision, pain, redness, blurry/double vision, flashing lights Cardiovascular: Denies chest pain/discomfort, tightness, palpitations, shortness of breath with activity, difficulty lying down, swelling, sudden awakening with shortness of breath Respiratory: Denies shortness of breath, cough, sputum production, wheezing Gastrointestinal: Denies dysphasia, heartburn, change in appetite, nausea, change in bowel habits, rectal bleeding, constipation, diarrhea, yellow skin or eyes Genitourinary: Denies frequency, urgency, burning/pain, blood in urine, incontinence, change in urinary strength. Musculoskeletal: Denies muscle/joint pain, stiffness, back pain, redness or swelling of joints, trauma Skin: Denies rashes, lumps, itching, dryness, color changes, or hair/nail changes Neurological: Denies dizziness, fainting, seizures, weakness, numbness, tingling, tremor Psychiatric - Denies nervousness, stress, depression or memory loss Endocrine: Denies heat or cold intolerance, sweating, frequent urination, excessive thirst, changes in appetite Hematologic: Denies ease of bruising or bleeding    Objective:    There were no vitals taken for this visit. Nursing note and vital signs reviewed.  Physical Exam Constitutional:      Appearance: He is well-developed. He is obese.  HENT:     Head: Normocephalic.     Right Ear: Hearing, tympanic membrane, ear canal and external  ear normal.     Left Ear: Hearing, tympanic membrane, ear canal and external ear normal.     Nose: Nose normal.     Mouth/Throat:     Pharynx: Uvula midline.  Eyes:     Conjunctiva/sclera: Conjunctivae normal.     Pupils: Pupils are equal, round, and  reactive to light.  Neck:     Thyroid: No thyromegaly.     Vascular: No JVD.     Trachea: No tracheal deviation.  Cardiovascular:     Rate and Rhythm: Normal rate and regular rhythm.     Heart sounds: Normal heart sounds.  Pulmonary:     Effort: Pulmonary effort is normal.     Breath sounds: Normal breath sounds.  Abdominal:     General: Bowel sounds are normal. There is no distension.     Palpations: Abdomen is soft. There is no mass.     Tenderness: There is no abdominal tenderness. There is no guarding or rebound.  Musculoskeletal:        General: No tenderness. Normal range of motion.     Cervical back: Neck supple.  Lymphadenopathy:     Cervical: No cervical adenopathy.  Skin:    General: Skin is warm and dry.  Neurological:     Mental Status: He is alert and oriented to person, place, and time.     Cranial Nerves: No cranial nerve deficit.     Motor: No abnormal muscle tone.     Coordination: Coordination normal.     Deep Tendon Reflexes: Reflexes are normal and symmetric.  Psychiatric:        Behavior: Behavior normal.        Thought Content: Thought content normal.        Judgment: Judgment normal.     Depression screen [facility] 2/9 06/26/2021 06/29/2020 06/19/2018 06/11/2017 04/30/2017  Decreased Interest 0 0 0 0 0  Down, Depressed, Hopeless 0 0 0 0 0  PHQ - 2 Score 0 0 0 0 0       Assessment & Plan:    Patient Active Problem List   Diagnosis Date Noted   Exam for clinical research 08/10/2021   Body mass index (BMI) 32.0-32.9, adult 06/26/2021   Acute respiratory failure due to COVID-19 ([place]) 07/26/2020   Hypertension 04/30/2017   HTN, white coat 02/21/2016   OTHER DISEASES OF NASAL CAVITY AND SINUSES 11/30/2009   WEIGHT GAIN 11/30/2009   TOBACCO ABUSE, HX OF 11/30/2009   ALLERGIC RHINITIS, SEASONAL 09/28/2007   Human immunodeficiency virus (HIV) disease (Thomas Armstrong) 08/18/2006     Problem List Items Addressed This Visit       Other   Exam for clinical  research - Primary    Mr. Kinn is being screened for entry into research study A5391. A physical exam was completed in compliance with the requirements of A5391 and is medically cleared for participation without reservation.        I have discontinued Solomon J. Dicostanzo's Biktarvy. I am also having him start on emtricitabine-tenofovir AF. Additionally, I am having him maintain his cetirizine, acetaminophen, Vitamin D3, zinc sulfate, sodium chloride, lisinopril, amoxicillin-clavulanate, azelastine, and cetirizine.   Follow-up: Per study protocol.    Terri Piedra, MSN, FNP-C Nurse Practitioner [facility] [place] number: [phone]

## 2021-08-10 NOTE — Assessment & Plan Note (Signed)
Thomas Armstrong is being screened for entry into research study [phone]. A physical exam was completed in compliance with the requirements of A5391 and is medically cleared for participation without reservation.

## 2021-08-16 ENCOUNTER — Encounter: Payer: Self-pay | Admitting: *Deleted

## 2021-08-16 LAB — CD4/CD8 (T-HELPER/T-SUPPRESSOR CELL)
CD4 % Helper T Cell: 58.6
CD4 Count: 996
CD8 % Suppressor T Cell: 19.1
CD8 T Cell Abs: 325

## 2021-08-23 ENCOUNTER — Encounter: Payer: Self-pay | Admitting: *Deleted

## 2021-08-23 LAB — HIV-1 RNA QUANT-NO REFLEX-BLD: HIV 1 RNA Quant: <40

## 2021-08-30 ENCOUNTER — Other Ambulatory Visit: Payer: Self-pay

## 2021-08-30 ENCOUNTER — Ambulatory Visit (INDEPENDENT_AMBULATORY_CARE_PROVIDER_SITE_OTHER): Payer: Self-pay | Admitting: *Deleted

## 2021-08-30 VITALS — Ht 73.0 in | Wt 275.0 lb

## 2021-08-30 DIAGNOSIS — Z1211 Encounter for screening for malignant neoplasm of colon: Secondary | ICD-10-CM

## 2021-08-30 NOTE — Progress Notes (Addendum)
Gastroenterology Pre-Procedure Review  Request Date: 08/30/2021 Requesting Physician: Dr. [name], no previous TCS  PATIENT REVIEW QUESTIONS: The patient responded to the following health history questions as indicated:    1. Diabetes Melitis: no 2. Joint replacements in the past 12 months: no 3. Major health problems in the past 3 months: no 4. Has an artificial valve or MVP: no 5. Has a defibrillator: no 6. Has been advised in past to take antibiotics in advance of a procedure like teeth cleaning: no 7. Family history of colon cancer: no  8. Alcohol Use: yes, 1 beer every 2 months 9. Illicit drug Use: no 10. History of sleep apnea: no  11. History of coronary artery or other vascular stents placed within the last 12 months: no 12. History of any prior anesthesia complications: no 13. Body mass index is 36.28 kg/m.    MEDICATIONS & ALLERGIES:    Patient reports the following regarding taking any blood thinners:   Plavix? no Aspirin? no Coumadin? no Brilinta? no Xarelto? no Eliquis? no Pradaxa? no Savaysa? no Effient? no  Patient confirms/reports the following medications:  Current Outpatient Medications  Medication Sig Dispense Refill   azelastine (ASTELIN) 0.1 % nasal spray Place 2 sprays into both nostrils as needed.     cetirizine (ZYRTEC) 10 MG tablet Take 10 mg by mouth daily.     Cholecalciferol (VITAMIN D3) 50 MCG (2000 UT) TABS Take 1 tablet by mouth daily.     emtricitabine-tenofovir AF (DESCOVY) 200-25 MG tablet Take 1 tablet by mouth daily. 90 tablet 1   Study - DO-IT A5391 - doravirine (PIFELTRO) 100 mg tablet ([place]) Take 1 tablet (100 mg total) by mouth daily. 30 tablet 11   zinc sulfate 220 (50 Zn) MG capsule Take 1 capsule (220 mg total) by mouth daily. 30 capsule 0   No current facility-administered medications for this visit.    Patient confirms/reports the following allergies:  Allergies  Allergen Reactions   Codeine     REACTION: makes  pt very sleepy    No orders of the defined types were placed in this encounter.   AUTHORIZATION INFORMATION Primary Insurance: [place],  [place] #: [id number],  Group #: [id number] Pre-Cert / Josem Kaufmann required: No, not required  SCHEDULE INFORMATION: Procedure has been scheduled as follows:  Date: 10/05/2021, Time: 10:30 Location: [facility] with Dr. Abbey Chatters  This Gastroenterology Pre-Precedure Review Form is being routed to the following provider(s): Aliene Altes, PA-C

## 2021-08-30 NOTE — Progress Notes (Signed)
OK to schedule with propofol with Dr. Abbey Chatters. ASA II.

## 2021-09-03 ENCOUNTER — Encounter: Payer: Self-pay | Admitting: *Deleted

## 2021-09-03 MED ORDER — NA SULFATE-K SULFATE-MG SULF 17.5-3.13-1.6 GM/177ML PO SOLN: 1.0000 | Freq: Once | ORAL | 0 refills | Status: AC

## 2021-09-03 NOTE — Addendum Note (Signed)
Addended by: Metro Kung on: 09/03/2021 09:00 AM   Modules accepted: Orders

## 2021-09-03 NOTE — Progress Notes (Signed)
Spoke to pt.  Scheduled procedure for 10/05/2021 with arrival at 9:00 at [facility].  Reviewed prep instructions with pt by phone.  Pt made aware that I am mailing out prep instructions.

## 2021-09-05 NOTE — Progress Notes (Signed)
Spoke to [employer] at [place].  She informed me that colonoscopies are covered starting at age 49.  REF#: [id number]

## 2021-09-07 ENCOUNTER — Ambulatory Visit: Payer: BLUE CROSS/BLUE SHIELD | Admitting: Gastroenterology

## 2021-09-07 ENCOUNTER — Encounter (INDEPENDENT_AMBULATORY_CARE_PROVIDER_SITE_OTHER): Payer: BLUE CROSS/BLUE SHIELD | Admitting: *Deleted

## 2021-09-07 ENCOUNTER — Other Ambulatory Visit: Payer: Self-pay

## 2021-09-07 ENCOUNTER — Ambulatory Visit: Payer: BLUE CROSS/BLUE SHIELD

## 2021-09-07 VITALS — BP 136/83 | HR 88 | Temp 98.0°F | Wt 279.2 lb

## 2021-09-07 DIAGNOSIS — Z006 Encounter for examination for normal comparison and control in clinical research program: Secondary | ICD-10-CM

## 2021-09-07 NOTE — Research (Signed)
Thomas Armstrong was here for his week 4 visit for A5391. He denies any new problems or medications. No side effects from the doravirine noted. He will be back in 8 weeks for the next study visit. ?

## 2021-09-08 LAB — COMPREHENSIVE METABOLIC PANEL
AG Ratio: 1.9 (calc) (ref 1.0–2.5)
ALT: 19 U/L (ref 9–46)
AST: 15 U/L (ref 10–40)
Albumin: 4.7 g/dL (ref 3.6–5.1)
Alkaline phosphatase (APISO): 64 U/L (ref 36–130)
BUN: 13 mg/dL (ref 7–25)
CO2: 25 mmol/L (ref 20–32)
Calcium: 9.9 mg/dL (ref 8.6–10.3)
Chloride: 102 mmol/L (ref 98–110)
Creat: 0.99 mg/dL (ref 0.60–1.29)
Globulin: 2.5 g/dL (calc) (ref 1.9–3.7)
Glucose, Bld: 77 mg/dL (ref 65–99)
Potassium: 4.1 mmol/L (ref 3.5–5.3)
Sodium: 141 mmol/L (ref 135–146)
Total Bilirubin: 0.4 mg/dL (ref 0.2–1.2)
Total Protein: 7.2 g/dL (ref 6.1–8.1)

## 2021-09-08 LAB — PHOSPHORUS: Phosphorus: 3.2 mg/dL (ref 2.5–4.5)

## 2021-09-08 LAB — BILIRUBIN, DIRECT: Bilirubin, Direct: 0.1 mg/dL (ref 0.0–0.2)

## 2021-09-27 ENCOUNTER — Encounter: Payer: Self-pay | Admitting: *Deleted

## 2021-09-27 LAB — HIV RNA, QUANTITATIVE, PCR
HIV 1 RNA Quant: <40
HIV 1 RNA Quant: <40

## 2021-09-28 NOTE — Patient Instructions (Signed)
? ? ? ? ? Thomas Armstrong ? 09/28/2021  ?  ? '@PREFPERIOPPHARMACY'$ @ ? ? Your procedure is scheduled on  10/05/2021. ? ? Report to [facility] at  [facility] ? ? Call this number if you have problems the morning of surgery: ? [phone] ? ? Remember: ? Follow the diet and prep instructions given to you by the office. ?  ? Take these medicines the morning of surgery with A SIP OF WATER  ? ?descovy, zyrtec, pifeltro. ?  ? Do not wear jewelry, make-up or nail polish. ? Do not wear lotions, powders, or perfumes, or deodorant. ? Do not shave 48 hours prior to surgery.  Men may shave face and neck. ? Do not bring valuables to the hospital. ? [facility] is not responsible for any belongings or valuables. ? ?Contacts, dentures or bridgework may not be worn into surgery.  Leave your suitcase in the car.  After surgery it may be brought to your room. ? ?For patients admitted to the hospital, discharge time will be determined by your treatment team. ? ?Patients discharged the day of surgery will not be allowed to drive home and must have someone with them for 24 hours.  ? ? ?Special instructions:   DO NOT smoke tobacco or vape for 24 hours before your procedure. ? ?Please read over the following fact sheets that you were given. ?Anesthesia Post-op Instructions and Care and Recovery After Surgery ?  ? ? ? Colonoscopy, Adult, Care After ?This sheet gives you information about how to care for yourself after your procedure. Your health care provider may also give you more specific instructions. If you have problems or questions, contact your health care provider. ?What can I expect after the procedure? ?After the procedure, it is common to have: ?A small amount of blood in your stool for 24 hours after the procedure. ?Some gas. ?Mild cramping or bloating of your abdomen. ?Follow these instructions at home: ?Eating and drinking ? ?Drink enough fluid to keep your urine pale yellow. ?Follow instructions from your health care provider  about eating or drinking restrictions. ?Resume your normal diet as instructed by your health care provider. Avoid heavy or fried foods that are hard to digest. ?Activity ?Rest as told by your health care provider. ?Avoid sitting for a long time without moving. Get up to take short walks every 1-2 hours. This is important to improve blood flow and breathing. Ask for help if you feel weak or unsteady. ?Return to your normal activities as told by your health care provider. Ask your health care provider what activities are safe for you. ?Managing cramping and bloating ? ?Try walking around when you have cramps or feel bloated. ?Apply heat to your abdomen as told by your health care provider. Use the heat source that your health care provider recommends, such as a moist heat pack or a heating pad. ?Place a towel between your skin and the heat source. ?Leave the heat on for 20-30 minutes. ?Remove the heat if your skin turns bright red. This is especially important if you are unable to feel pain, heat, or cold. You may have a greater risk of getting burned. ?General instructions ?If you were given a sedative during the procedure, it can affect you for several hours. Do not drive or operate machinery until your health care provider says that it is safe. ?For the first 24 hours after the procedure: ?Do not sign important documents. ?Do not drink alcohol. ?Do your  regular daily activities at a slower pace than normal. ?Eat soft foods that are easy to digest. ?Take over-the-counter and prescription medicines only as told by your health care provider. ?Keep all follow-up visits as told by your health care provider. This is important. ?Contact a health care provider if: ?You have blood in your stool 2-3 days after the procedure. ?Get help right away if you have: ?More than a small spotting of blood in your stool. ?Large blood clots in your stool. ?Swelling of your abdomen. ?Nausea or vomiting. ?A fever. ?Increasing pain in your  abdomen that is not relieved with medicine. ?Summary ?After the procedure, it is common to have a small amount of blood in your stool. You may also have mild cramping and bloating of your abdomen. ?If you were given a sedative during the procedure, it can affect you for several hours. Do not drive or operate machinery until your health care provider says that it is safe. ?Get help right away if you have a lot of blood in your stool, nausea or vomiting, a fever, or increased pain in your abdomen. ?This information is not intended to replace advice given to you by your health care provider. Make sure you discuss any questions you have with your health care provider. ?Document Revised: 04/30/2019 Document Reviewed: 01/18/2019 ?Elsevier Patient Education ? [employer]. ? ?Monitored Anesthesia Care, Care After ?This sheet gives you information about how to care for yourself after your procedure. Your health care provider may also give you more specific instructions. If you have problems or questions, contact your health care provider. ?What can I expect after the procedure? ?After the procedure, it is common to have: ?Tiredness. ?Forgetfulness about what happened after the procedure. ?Impaired judgment for important decisions. ?Nausea or vomiting. ?Some difficulty with balance. ?Follow these instructions at home: ?For the time period you were told by your health care provider: ?  ?Rest as needed. ?Do not participate in activities where you could fall or become injured. ?Do not drive or use machinery. ?Do not drink alcohol. ?Do not take sleeping pills or medicines that cause drowsiness. ?Do not make important decisions or sign legal documents. ?Do not take care of children on your own. ?Eating and drinking ?Follow the diet that is recommended by your health care provider. ?Drink enough fluid to keep your urine pale yellow. ?If you vomit: ?Drink water, juice, or soup when you can drink without vomiting. ?Make sure  you have little or no nausea before eating solid foods. ?General instructions ?Have a responsible adult stay with you for the time you are told. It is important to have someone help care for you until you are awake and alert. ?Take over-the-counter and prescription medicines only as told by your health care provider. ?If you have sleep apnea, surgery and certain medicines can increase your risk for breathing problems. Follow instructions from your health care provider about wearing your sleep device: ?Anytime you are sleeping, including during daytime naps. ?While taking prescription pain medicines, sleeping medicines, or medicines that make you drowsy. ?Avoid smoking. ?Keep all follow-up visits as told by your health care provider. This is important. ?Contact a health care provider if: ?You keep feeling nauseous or you keep vomiting. ?You feel light-headed. ?You are still sleepy or having trouble with balance after 24 hours. ?You develop a rash. ?You have a fever. ?You have redness or swelling around the IV site. ?Get help right away if: ?You have trouble breathing. ?You have new-onset confusion at  home. ?Summary ?For several hours after your procedure, you may feel tired. You may also be forgetful and have poor judgment. ?Have a responsible adult stay with you for the time you are told. It is important to have someone help care for you until you are awake and alert. ?Rest as told. Do not drive or operate machinery. Do not drink alcohol or take sleeping pills. ?Get help right away if you have trouble breathing, or if you suddenly become confused. ?This information is not intended to replace advice given to you by your health care provider. Make sure you discuss any questions you have with your health care provider. ?Document Revised: 03/09/2020 Document Reviewed: 05/27/2019 ?Elsevier Patient Education ? [place]. ? ?

## 2021-10-01 ENCOUNTER — Encounter (HOSPITAL_COMMUNITY)
Admission: RE | Admit: 2021-10-01 | Discharge: 2021-10-01 | Disposition: A | Discharge status: Home / Self Care | Payer: BLUE CROSS/BLUE SHIELD | Source: Ambulatory Visit | Attending: Internal Medicine | Admitting: Internal Medicine

## 2021-10-05 ENCOUNTER — Ambulatory Visit (HOSPITAL_COMMUNITY)
Admission: RE | Admit: 2021-10-05 | Discharge: 2021-10-05 | Disposition: A | Discharge status: Home / Self Care | Payer: BLUE CROSS/BLUE SHIELD | Attending: Internal Medicine | Admitting: Internal Medicine

## 2021-10-05 ENCOUNTER — Encounter (HOSPITAL_COMMUNITY): Payer: Self-pay

## 2021-10-05 ENCOUNTER — Encounter (HOSPITAL_COMMUNITY)
Admission: RE | Disposition: A | Discharge status: Home / Self Care | Payer: Self-pay | Source: Home / Self Care | Attending: Internal Medicine

## 2021-10-05 ENCOUNTER — Ambulatory Visit (HOSPITAL_COMMUNITY): Payer: BLUE CROSS/BLUE SHIELD | Admitting: Certified Registered Nurse Anesthetist

## 2021-10-05 DIAGNOSIS — Z6836 Body mass index (BMI) 36.0-36.9, adult: Secondary | ICD-10-CM | POA: Diagnosis not present

## 2021-10-05 DIAGNOSIS — Z87891 Personal history of nicotine dependence: Secondary | ICD-10-CM | POA: Insufficient documentation

## 2021-10-05 DIAGNOSIS — D125 Benign neoplasm of sigmoid colon: Secondary | ICD-10-CM | POA: Diagnosis not present

## 2021-10-05 DIAGNOSIS — K635 Polyp of colon: Secondary | ICD-10-CM | POA: Diagnosis not present

## 2021-10-05 DIAGNOSIS — I10 Essential (primary) hypertension: Secondary | ICD-10-CM | POA: Diagnosis not present

## 2021-10-05 DIAGNOSIS — Z1211 Encounter for screening for malignant neoplasm of colon: Secondary | ICD-10-CM | POA: Diagnosis not present

## 2021-10-05 DIAGNOSIS — D123 Benign neoplasm of transverse colon: Secondary | ICD-10-CM

## 2021-10-05 DIAGNOSIS — Z139 Encounter for screening, unspecified: Secondary | ICD-10-CM | POA: Diagnosis not present

## 2021-10-05 DIAGNOSIS — K648 Other hemorrhoids: Secondary | ICD-10-CM | POA: Insufficient documentation

## 2021-10-05 HISTORY — PX: POLYPECTOMY: SHX5525

## 2021-10-05 HISTORY — PX: COLONOSCOPY WITH PROPOFOL: SHX5780

## 2021-10-05 SURGERY — COLONOSCOPY WITH PROPOFOL
Anesthesia: General

## 2021-10-05 MED ORDER — PROPOFOL 500 MG/50ML IV EMUL
INTRAVENOUS | Status: DC | PRN
  Administered 2021-10-05: 150 ug/kg/min via INTRAVENOUS

## 2021-10-05 MED ORDER — PROPOFOL 10 MG/ML IV BOLUS
INTRAVENOUS | Status: DC | PRN
  Administered 2021-10-05: 120 mg via INTRAVENOUS

## 2021-10-05 MED ORDER — LIDOCAINE HCL (CARDIAC) PF 100 MG/5ML IV SOSY
PREFILLED_SYRINGE | INTRAVENOUS | Status: DC | PRN
  Administered 2021-10-05: 60 mg via INTRATRACHEAL

## 2021-10-05 MED ORDER — LACTATED RINGERS IV SOLN
INTRAVENOUS | Status: DC
  Administered 2021-10-05: 1000 mL via INTRAVENOUS

## 2021-10-05 MED ORDER — LACTATED RINGERS IV SOLN
INTRAVENOUS | Status: DC | PRN
Start: 2021-10-05 — End: 2021-10-05

## 2021-10-05 NOTE — Op Note (Signed)
[facility] ?Patient Name: Thomas Armstrong ?Procedure Date: 10/05/2021 10:10 AM ?MRN: [id number] ?Date of Birth: 07-Dec-1970 ?Attending MD: Elon Alas. Abbey Chatters , DO ?CSN: [id number] ?Age: 51 ?Admit Type: Outpatient ?Procedure:                Colonoscopy ?Indications:              Screening for colorectal malignant neoplasm ?Providers:                Elon Alas. Abbey Chatters, DO, Charlsie Quest. Theda Sers RN, Therapist, sports,  ?                          Randa Spike, Technician ?Referring MD:              ?Medicines:                See the Anesthesia note for documentation of the  ?                          administered medications ?Complications:            No immediate complications. ?Estimated Blood Loss:     Estimated blood loss was minimal. ?Procedure:                Pre-Anesthesia Assessment: ?                          - The anesthesia plan was to use monitored  ?                          anesthesia care (MAC). ?                          After obtaining informed consent, the colonoscope  ?                          was passed under direct vision. Throughout the  ?                          procedure, the patient's blood pressure, pulse, and  ?                          oxygen saturations were monitored continuously. The  ?                          PCF-HQ190L ([id number]) scope was introduced through  ?                          the anus and advanced to the the cecum, identified  ?                          by appendiceal orifice and ileocecal valve. The  ?                          colonoscopy was performed without difficulty. The  ?                          patient tolerated the procedure  well. The quality  ?                          of the bowel preparation was evaluated using the  ?                          BBPS [facility] Bowel Preparation Scale) with scores  ?                          of: Right Colon = 3, Transverse Colon = 3 and Left  ?                          Colon = 3 (entire mucosa seen well with no residual  ?                           staining, small fragments of stool or opaque  ?                          liquid). The total BBPS score equals 9. ?Scope In: 10:20:19 AM ?Scope Out: 10:28:05 AM ?Scope Withdrawal Time: 0 hours 6 minutes 13 seconds  ?Total Procedure Duration: 0 hours 7 minutes 46 seconds  ?Findings: ?     The perianal and digital rectal examinations were normal. ?     Non-bleeding internal hemorrhoids were found during endoscopy. ?     A 5 mm polyp was found in the transverse colon. The polyp was sessile.  ?     The polyp was removed with a cold snare. Resection and retrieval were  ?     complete. ?     A 4 mm polyp was found in the sigmoid colon. The polyp was pedunculated.  ?     The polyp was removed with a cold snare. Resection and retrieval were  ?     complete. ?     The exam was otherwise without abnormality. ?Impression:               - Non-bleeding internal hemorrhoids. ?                          - One 5 mm polyp in the transverse colon, removed  ?                          with a cold snare. Resected and retrieved. ?                          - One 4 mm polyp in the sigmoid colon, removed with  ?                          a cold snare. Resected and retrieved. ?                          - The examination was otherwise normal. ?Moderate Sedation: ?     Per Anesthesia Care ?Recommendation:           - Patient has a contact number available for  ?  emergencies. The signs and symptoms of potential  ?                          delayed complications were discussed with the  ?                          patient. Return to normal activities tomorrow.  ?                          Written discharge instructions were provided to the  ?                          patient. ?                          - Resume previous diet. ?                          - Continue present medications. ?                          - Await pathology results. ?                          - Repeat colonoscopy in 5 years for surveillance. ?                           - Return to GI clinic PRN. ?Procedure Code(s):        --- Professional --- ?                          [id number], Colonoscopy, flexible; with removal of  ?                          tumor(s), polyp(s), or other lesion(s) by snare  ?                          technique ?Diagnosis Code(s):        --- Professional --- ?                          Z12.11, Encounter for screening for malignant  ?                          neoplasm of colon ?                          K63.5, Polyp of colon ?                          K64.8, Other hemorrhoids ?CPT copyright 2019 American Medical Association. All rights reserved. ?The codes documented in this report are preliminary and upon coder review may  ?be revised to meet current compliance requirements. ?Elon Alas. Abbey Chatters, DO ?Elon Alas. Dorsey Authement, DO ?10/05/2021 10:30:47 AM ?This report has been signed electronically. ?Number of Addenda: 0 ?

## 2021-10-05 NOTE — Discharge Instructions (Addendum)
?  Colonoscopy Discharge Instructions  Read the instructions outlined below and refer to this sheet in the next few weeks. These discharge instructions provide you with general information on caring for yourself after you leave the hospital. Your doctor may also give you specific instructions. While your treatment has been planned according to the most current medical practices available, unavoidable complications occasionally occur.   ACTIVITY You may resume your regular activity, but move at a slower pace for the next 24 hours.  Take frequent rest periods for the next 24 hours.  Walking will help get rid of the air and reduce the bloated feeling in your belly (abdomen).  No driving for 24 hours (because of the medicine (anesthesia) used during the test).   Do not sign any important legal documents or operate any machinery for 24 hours (because of the anesthesia used during the test).  NUTRITION Drink plenty of fluids.  You may resume your normal diet as instructed by your doctor.  Begin with a light meal and progress to your normal diet. Heavy or fried foods are harder to digest and may make you feel sick to your stomach (nauseated).  Avoid alcoholic beverages for 24 hours or as instructed.  MEDICATIONS You may resume your normal medications unless your doctor tells you otherwise.  WHAT YOU CAN EXPECT TODAY Some feelings of bloating in the abdomen.  Passage of more gas than usual.  Spotting of blood in your stool or on the toilet paper.  IF YOU HAD POLYPS REMOVED DURING THE COLONOSCOPY: No aspirin products for 7 days or as instructed.  No alcohol for 7 days or as instructed.  Eat a soft diet for the next 24 hours.  FINDING OUT THE RESULTS OF YOUR TEST Not all test results are available during your visit. If your test results are not back during the visit, make an appointment with your caregiver to find out the results. Do not assume everything is normal if you have not heard from your  caregiver or the medical facility. It is important for you to follow up on all of your test results.  SEEK IMMEDIATE MEDICAL ATTENTION IF: You have more than a spotting of blood in your stool.  Your belly is swollen (abdominal distention).  You are nauseated or vomiting.  You have a temperature over 101.  You have abdominal pain or discomfort that is severe or gets worse throughout the day.   Your colonoscopy revealed 2 polyp(s) which I removed successfully. Await pathology results, my office will contact you. I recommend repeating colonoscopy in 5 years for surveillance purposes. Otherwise follow up with Gi as needed.   I hope you have a great rest of your week!  Charles K. Carver, D.O. Gastroenterology and Hepatology [facility]  

## 2021-10-05 NOTE — Anesthesia Postprocedure Evaluation (Signed)
Anesthesia Post Note ? ?Patient: Thomas Armstrong ? ?Procedure(s) Performed: COLONOSCOPY WITH PROPOFOL ?POLYPECTOMY ? ?Patient location during evaluation: Phase II ?Anesthesia Type: General ?Level of consciousness: awake ?Pain management: pain level controlled ?Vital Signs Assessment: post-procedure vital signs reviewed and stable ?Respiratory status: spontaneous breathing and respiratory function stable ?Cardiovascular status: blood pressure returned to baseline and stable ?Postop Assessment: no headache and no apparent nausea or vomiting ?Anesthetic complications: no ?Comments: Late entry ? ? ?No notable events documented. ? ? ?Last Vitals:  ?Vitals:  ? 10/05/21 1004 10/05/21 1034  ?BP: 124/86 99/67  ?Resp: 18 19  ?Temp: 36.7 ?C 36.7 ?C  ?SpO2: 97% 94%  ?  ?Last Pain:  ?Vitals:  ? 10/05/21 1034  ?TempSrc: Oral  ?PainSc: 0-No pain  ? ? ?  ?  ?  ?  ?  ?  ? ?Louann Sjogren ? ? ? ? ?

## 2021-10-05 NOTE — Transfer of Care (Signed)
Immediate Anesthesia Transfer of Care Note ? ?Patient: Raywood Wailes Ostrander ? ?Procedure(s) Performed: COLONOSCOPY WITH PROPOFOL ?POLYPECTOMY ? ?Patient Location: PACU ? ?Anesthesia Type:General ? ?Level of Consciousness: awake and alert  ? ?Airway & Oxygen Therapy: Patient Spontanous Breathing ? ?Post-op Assessment: Report given to RN and Post -op Vital signs reviewed and stable ? ?Post vital signs: Reviewed and stable ? ?Last Vitals:  ?Vitals Value Taken Time  ?BP    ?Temp    ?Pulse    ?Resp    ?SpO2    ? ? ?Last Pain:  ?Vitals:  ? 10/05/21 1016  ?TempSrc:   ?PainSc: 0-No pain  ?   ? ?Patients Stated Pain Goal: 6 (10/05/21 1004) ? ?Complications: No notable events documented. ?

## 2021-10-05 NOTE — H&P (Signed)
Primary Care Physician:  [name], MD ?Primary Gastroenterologist:  Dr. Abbey Chatters ? ?Pre-Procedure History & Physical: ?HPI:  Thomas Armstrong is a 51 y.o. male is here for first ever colonoscopy for colon cancer screening purposes.  Patient denies any family history of colorectal cancer.  No melena or hematochezia.  No abdominal pain or unintentional weight loss.  No change in bowel habits.  Overall feels well from a GI standpoint. ? ?Past Medical History:  ?Diagnosis Date  ? HIV infection ([place])   ? HTN, white coat 02/21/2016  ? Hypertension   ? Hypertension 04/30/2017  ? ? ?History reviewed. No pertinent surgical history. ? ?Prior to Admission medications   ?Medication Sig Start Date End Date Taking? Authorizing Provider  ?azelastine (ASTELIN) 0.1 % nasal spray Place 2 sprays into both nostrils daily as needed for allergies. 07/03/21  Yes [provider]  ?cetirizine (ZYRTEC) 10 MG tablet Take 10 mg by mouth daily.   Yes [provider]  ?Cholecalciferol (VITAMIN D3) 50 MCG (2000 UT) TABS Take 2,000 Units by mouth daily.   Yes [provider]  ?emtricitabine-tenofovir AF (DESCOVY) 200-25 MG tablet Take 1 tablet by mouth daily. 08/10/21  Yes Golden Circle, FNP  ?Study - DO-IT G9211 - doravirine (PIFELTRO) 100 mg tablet (PI-[place]) Take 1 tablet (100 mg total) by mouth daily. 08/10/21  Yes Tommy Medal, Lavell Islam, MD  ?zinc sulfate 220 (50 Zn) MG capsule Take 1 capsule (220 mg total) by mouth daily. 07/28/20  Yes Murlean Iba, MD  ? ? ?Allergies as of 09/03/2021 - Review Complete 08/30/2021  ?Allergen Reaction Noted  ? Codeine  02/01/2008  ? ? ?Family History  ?Problem Relation Age of Onset  ? Stroke Mother   ? Stroke Father   ? Heart disease Paternal Grandmother   ? Stroke Paternal Grandmother   ? ? ?Social History  ? ?Socioeconomic History  ? Marital status: Married  ?  Spouse name: Not on file  ? Number of children: Not on file  ? Years of education: Not on file  ? Highest education  level: Not on file  ?Occupational History  ? Not on file  ?Tobacco Use  ? Smoking status: Former  ?  Types: Cigarettes  ?  Quit date: 2020  ?  Years since quitting: 3.2  ? Smokeless tobacco: Never  ?Vaping Use  ? Vaping Use: Some days  ? Substances: Nicotine, Flavoring  ?Substance and Sexual Activity  ? Alcohol use: Yes  ?  Alcohol/week: 0.0 standard drinks  ?  Comment: rarely  ? Drug use: No  ? Sexual activity: Yes  ?  Comment: declined condoms  ?Other Topics Concern  ? Not on file  ?Social History Narrative  ? Not on file  ? ?Social Determinants of Health  ? ?Financial Resource Strain: Not on file  ?Food Insecurity: Not on file  ?Transportation Needs: Not on file  ?Physical Activity: Not on file  ?Stress: Not on file  ?Social Connections: Not on file  ?Intimate Partner Violence: Not on file  ? ? ?Review of Systems: ?See HPI, otherwise negative ROS ? ?Physical Exam: ?Vital signs in last 24 hours: ?Temp:  [98.1 ?F (36.7 ?C)] 98.1 ?F (36.7 ?C) (03/31 1004) ?Resp:  [18] 18 (03/31 1004) ?BP: (124)/(86) 124/86 (03/31 1004) ?SpO2:  [97 %] 97 % (03/31 1004) ?  ?General:   Alert,  Well-developed, well-nourished, pleasant and cooperative in NAD ?Head:  Normocephalic and atraumatic. ?Eyes:  Sclera clear, no icterus.  Conjunctiva pink. ?Ears:  Normal auditory acuity. ?Nose:  No deformity, discharge,  or lesions. ?Mouth:  No deformity or lesions, dentition normal. ?Neck:  Supple; no masses or thyromegaly. ?Lungs:  Clear throughout to auscultation.   No wheezes, crackles, or rhonchi. No acute distress. ?Heart:  Regular rate and rhythm; no murmurs, clicks, rubs,  or gallops. ?Abdomen:  Soft, nontender and nondistended. No masses, hepatosplenomegaly or hernias noted. Normal bowel sounds, without guarding, and without rebound.   ?Msk:  Symmetrical without gross deformities. Normal posture. ?Extremities:  Without clubbing or edema. ?Neurologic:  Alert and  oriented x4;  grossly normal neurologically. ?Skin:  Intact without  significant lesions or rashes. ?Cervical Nodes:  No significant cervical adenopathy. ?Psych:  Alert and cooperative. Normal mood and affect. ? ?Impression/Plan: ?Thomas Armstrong is here for a colonoscopy to be performed for colon cancer screening purposes. ? ?The risks of the procedure including infection, bleed, or perforation as well as benefits, limitations, alternatives and imponderables have been reviewed with the patient. Questions have been answered. All parties agreeable. ? ? ?

## 2021-10-05 NOTE — Anesthesia Preprocedure Evaluation (Signed)
Anesthesia Evaluation  ?Patient identified by MRN, date of birth, ID band ?Patient awake ? ? ? ?Reviewed: ?Allergy & Precautions, H&P , NPO status , Patient's Chart, lab work & pertinent test results, reviewed documented beta blocker date and time  ? ?Airway ?Mallampati: II ? ?TM Distance: >3 FB ?Neck ROM: full ? ? ? Dental ?no notable dental hx. ? ?  ?Pulmonary ?neg pulmonary ROS, former smoker,  ?  ?Pulmonary exam normal ?breath sounds clear to auscultation ? ? ? ? ? ? Cardiovascular ?Exercise Tolerance: Good ?hypertension, negative cardio ROS ? ? ?Rhythm:regular Rate:Normal ? ? ?  ?Neuro/Psych ?negative neurological ROS ? negative psych ROS  ? GI/Hepatic ?negative GI ROS, Neg liver ROS,   ?Endo/Other  ?Morbid obesity ? Renal/GU ?negative Renal ROS  ?negative genitourinary ?  ?Musculoskeletal ? ? Abdominal ?  ?Peds ? Hematology ? ?(+) HIV,   ?Anesthesia Other Findings ? ? Reproductive/Obstetrics ?negative OB ROS ? ?  ? ? ? ? ? ? ? ? ? ? ? ? ? ?  ?  ? ? ? ? ? ? ? ? ?Anesthesia Physical ?Anesthesia Plan ? ?ASA: 3 ? ?Anesthesia Plan: General  ? ?Post-op Pain Management:   ? ?Induction:  ? ?PONV Risk Score and Plan: Propofol infusion ? ?Airway Management Planned:  ? ?Additional Equipment:  ? ?Intra-op Plan:  ? ?Post-operative Plan:  ? ?Informed Consent: I have reviewed the patients History and Physical, chart, labs and discussed the procedure including the risks, benefits and alternatives for the proposed anesthesia with the patient or authorized representative who has indicated his/her understanding and acceptance.  ? ? ? ?Dental Advisory Given ? ?Plan Discussed with: CRNA ? ?Anesthesia Plan Comments:   ? ? ? ? ? ? ?Anesthesia Quick Evaluation ? ?

## 2021-10-08 LAB — SURGICAL PATHOLOGY

## 2021-10-09 ENCOUNTER — Encounter (HOSPITAL_COMMUNITY): Payer: Self-pay | Admitting: Internal Medicine

## 2021-11-02 ENCOUNTER — Encounter (INDEPENDENT_AMBULATORY_CARE_PROVIDER_SITE_OTHER): Payer: BLUE CROSS/BLUE SHIELD | Admitting: *Deleted

## 2021-11-02 ENCOUNTER — Other Ambulatory Visit: Payer: Self-pay | Admitting: *Deleted

## 2021-11-02 ENCOUNTER — Encounter: Payer: Self-pay | Admitting: Infectious Disease

## 2021-11-02 ENCOUNTER — Other Ambulatory Visit: Payer: Self-pay

## 2021-11-02 ENCOUNTER — Other Ambulatory Visit: Payer: Self-pay | Admitting: Physician Assistant

## 2021-11-02 VITALS — BP 131/84 | HR 89 | Temp 97.8°F | Wt 278.6 lb

## 2021-11-02 DIAGNOSIS — B20 Human immunodeficiency virus [HIV] disease: Secondary | ICD-10-CM

## 2021-11-02 DIAGNOSIS — Z006 Encounter for examination for normal comparison and control in clinical research program: Secondary | ICD-10-CM

## 2021-11-02 MED ORDER — BIKTARVY 50-200-25 MG PO TABS: 1.0000 | ORAL_TABLET | Freq: Every day | ORAL | 5 refills | Status: DC

## 2021-11-02 NOTE — Research (Signed)
Thomas Armstrong came in today for his week 12 visit for [id number]. He said he had started [facility] on Monday which unfortuantely disqualifies him from the study. We ended up doing a premature discontinuation visit instead. He will go for his DEXA at 11 am today. He said that his doctor had recommended he go ahead and start [facility] for weight loss now and he didn't want to miss the opportunity since he was still gaining weight. ?

## 2021-11-03 LAB — COMPREHENSIVE METABOLIC PANEL
AG Ratio: 1.7 (calc) (ref 1.0–2.5)
ALT: 15 U/L (ref 9–46)
AST: 14 U/L (ref 10–40)
Albumin: 4.5 g/dL (ref 3.6–5.1)
Alkaline phosphatase (APISO): 61 U/L (ref 36–130)
BUN: 18 mg/dL (ref 7–25)
CO2: 27 mmol/L (ref 20–32)
Calcium: 10 mg/dL (ref 8.6–10.3)
Chloride: 101 mmol/L (ref 98–110)
Creat: 1.14 mg/dL (ref 0.60–1.29)
Globulin: 2.6 g/dL (calc) (ref 1.9–3.7)
Glucose, Bld: 82 mg/dL (ref 65–99)
Potassium: 4.5 mmol/L (ref 3.5–5.3)
Sodium: 139 mmol/L (ref 135–146)
Total Bilirubin: 0.5 mg/dL (ref 0.2–1.2)
Total Protein: 7.1 g/dL (ref 6.1–8.1)

## 2021-11-03 LAB — LIPID PANEL
Cholesterol: 188 mg/dL (ref <200)
HDL: 36 mg/dL — ABNORMAL LOW (ref >=40)
LDL Cholesterol (Calc): 120 mg/dL (calc) — ABNORMAL HIGH
Non-HDL Cholesterol (Calc): 152 mg/dL (calc) — ABNORMAL HIGH (ref <130)
Total CHOL/HDL Ratio: 5.2 (calc) — ABNORMAL HIGH (ref <5.0)
Triglycerides: 198 mg/dL — ABNORMAL HIGH (ref <150)

## 2021-11-03 LAB — URINALYSIS, ROUTINE W REFLEX MICROSCOPIC
Bilirubin Urine: NEGATIVE
Glucose, UA: NEGATIVE
Hgb urine dipstick: NEGATIVE
Ketones, ur: NEGATIVE
Leukocytes,Ua: NEGATIVE
Nitrite: NEGATIVE
Protein, ur: NEGATIVE
Specific Gravity, Urine: 1.019 (ref 1.001–1.035)
pH: 6 (ref 5.0–8.0)

## 2021-11-03 LAB — BILIRUBIN, DIRECT: Bilirubin, Direct: 0.1 mg/dL (ref 0.0–0.2)

## 2021-11-03 LAB — PHOSPHORUS: Phosphorus: 3.6 mg/dL (ref 2.5–4.5)

## 2021-11-07 ENCOUNTER — Encounter: Payer: Self-pay | Admitting: *Deleted

## 2021-11-07 LAB — CD4/CD8 (T-HELPER/T-SUPPRESSOR CELL)
CD4 % Helper T Cell: 54.6
CD4 Count: 819
CD8 % Suppressor T Cell: 20.8
CD8 T Cell Abs: 312

## 2021-11-16 ENCOUNTER — Encounter: Payer: Self-pay | Admitting: *Deleted

## 2021-11-16 LAB — HIV-1 RNA QUANT-NO REFLEX-BLD: HIV 1 RNA Quant: <40

## 2021-11-20 DIAGNOSIS — G4730 Sleep apnea, unspecified: Secondary | ICD-10-CM | POA: Diagnosis not present

## 2021-11-20 DIAGNOSIS — E6609 Other obesity due to excess calories: Secondary | ICD-10-CM | POA: Diagnosis not present

## 2021-11-20 DIAGNOSIS — Z6837 Body mass index (BMI) 37.0-37.9, adult: Secondary | ICD-10-CM | POA: Diagnosis not present

## 2021-12-05 DIAGNOSIS — G4730 Sleep apnea, unspecified: Secondary | ICD-10-CM | POA: Diagnosis not present

## 2021-12-07 ENCOUNTER — Encounter: Payer: Self-pay | Admitting: Infectious Disease

## 2021-12-07 DIAGNOSIS — B20 Human immunodeficiency virus [HIV] disease: Secondary | ICD-10-CM

## 2021-12-10 MED ORDER — BIKTARVY 50-200-25 MG PO TABS: 1.0000 | ORAL_TABLET | Freq: Every day | ORAL | 5 refills | Status: DC

## 2021-12-25 DIAGNOSIS — G4733 Obstructive sleep apnea (adult) (pediatric): Secondary | ICD-10-CM | POA: Diagnosis not present

## 2022-01-07 DIAGNOSIS — G4733 Obstructive sleep apnea (adult) (pediatric): Secondary | ICD-10-CM | POA: Diagnosis not present

## 2022-01-07 DIAGNOSIS — R0683 Snoring: Secondary | ICD-10-CM | POA: Diagnosis not present

## 2022-01-18 ENCOUNTER — Other Ambulatory Visit (HOSPITAL_BASED_OUTPATIENT_CLINIC_OR_DEPARTMENT_OTHER): Payer: Self-pay

## 2022-01-18 MED ORDER — WEGOVY 1 MG/0.5ML ~~LOC~~ SOAJ
SUBCUTANEOUS | 0 refills | Status: AC
  Filled 2022-01-18: qty 2, 28d supply, fill #0

## 2022-02-07 DIAGNOSIS — R0683 Snoring: Secondary | ICD-10-CM | POA: Diagnosis not present

## 2022-02-07 DIAGNOSIS — G4733 Obstructive sleep apnea (adult) (pediatric): Secondary | ICD-10-CM | POA: Diagnosis not present

## 2022-02-11 DIAGNOSIS — G4733 Obstructive sleep apnea (adult) (pediatric): Secondary | ICD-10-CM | POA: Diagnosis not present

## 2022-03-04 ENCOUNTER — Encounter: Payer: Self-pay | Admitting: Infectious Disease

## 2022-03-05 ENCOUNTER — Other Ambulatory Visit (HOSPITAL_COMMUNITY): Payer: Self-pay

## 2022-03-08 ENCOUNTER — Other Ambulatory Visit (HOSPITAL_COMMUNITY): Payer: Self-pay

## 2022-03-08 ENCOUNTER — Encounter: Payer: Self-pay | Admitting: Infectious Disease

## 2022-03-10 DIAGNOSIS — R0683 Snoring: Secondary | ICD-10-CM | POA: Diagnosis not present

## 2022-03-10 DIAGNOSIS — G4733 Obstructive sleep apnea (adult) (pediatric): Secondary | ICD-10-CM | POA: Diagnosis not present

## 2022-03-14 ENCOUNTER — Other Ambulatory Visit (HOSPITAL_COMMUNITY): Payer: Self-pay

## 2022-03-14 DIAGNOSIS — G4733 Obstructive sleep apnea (adult) (pediatric): Secondary | ICD-10-CM | POA: Diagnosis not present

## 2022-04-09 DIAGNOSIS — G4733 Obstructive sleep apnea (adult) (pediatric): Secondary | ICD-10-CM | POA: Diagnosis not present

## 2022-04-09 DIAGNOSIS — R0683 Snoring: Secondary | ICD-10-CM | POA: Diagnosis not present

## 2022-05-06 ENCOUNTER — Other Ambulatory Visit: Payer: Self-pay

## 2022-05-06 DIAGNOSIS — B20 Human immunodeficiency virus [HIV] disease: Secondary | ICD-10-CM

## 2022-05-06 MED ORDER — BIKTARVY 50-200-25 MG PO TABS: 1.0000 | ORAL_TABLET | Freq: Every day | ORAL | 0 refills | Status: DC

## 2022-05-10 DIAGNOSIS — R0683 Snoring: Secondary | ICD-10-CM | POA: Diagnosis not present

## 2022-05-10 DIAGNOSIS — G4733 Obstructive sleep apnea (adult) (pediatric): Secondary | ICD-10-CM | POA: Diagnosis not present

## 2022-05-14 DIAGNOSIS — G4733 Obstructive sleep apnea (adult) (pediatric): Secondary | ICD-10-CM | POA: Diagnosis not present

## 2022-05-14 DIAGNOSIS — Z125 Encounter for screening for malignant neoplasm of prostate: Secondary | ICD-10-CM | POA: Diagnosis not present

## 2022-05-14 DIAGNOSIS — Z6836 Body mass index (BMI) 36.0-36.9, adult: Secondary | ICD-10-CM | POA: Diagnosis not present

## 2022-05-14 DIAGNOSIS — E6609 Other obesity due to excess calories: Secondary | ICD-10-CM | POA: Diagnosis not present

## 2022-05-14 DIAGNOSIS — Z13220 Encounter for screening for lipoid disorders: Secondary | ICD-10-CM | POA: Diagnosis not present

## 2022-05-14 DIAGNOSIS — U071 COVID-19: Secondary | ICD-10-CM | POA: Diagnosis not present

## 2022-05-14 DIAGNOSIS — Z1389 Encounter for screening for other disorder: Secondary | ICD-10-CM | POA: Diagnosis not present

## 2022-05-14 DIAGNOSIS — Z1331 Encounter for screening for depression: Secondary | ICD-10-CM | POA: Diagnosis not present

## 2022-05-14 DIAGNOSIS — E119 Type 2 diabetes mellitus without complications: Secondary | ICD-10-CM | POA: Diagnosis not present

## 2022-05-14 DIAGNOSIS — Z0001 Encounter for general adult medical examination with abnormal findings: Secondary | ICD-10-CM | POA: Diagnosis not present

## 2022-05-14 DIAGNOSIS — G4730 Sleep apnea, unspecified: Secondary | ICD-10-CM | POA: Diagnosis not present

## 2022-06-09 DIAGNOSIS — R0683 Snoring: Secondary | ICD-10-CM | POA: Diagnosis not present

## 2022-06-09 DIAGNOSIS — G4733 Obstructive sleep apnea (adult) (pediatric): Secondary | ICD-10-CM | POA: Diagnosis not present

## 2022-06-12 ENCOUNTER — Other Ambulatory Visit: Payer: Self-pay

## 2022-06-12 ENCOUNTER — Other Ambulatory Visit: Payer: BLUE CROSS/BLUE SHIELD

## 2022-06-12 ENCOUNTER — Other Ambulatory Visit (HOSPITAL_COMMUNITY)
Admission: RE | Admit: 2022-06-12 | Discharge: 2022-06-12 | Disposition: A | Discharge status: Home / Self Care | Payer: BLUE CROSS/BLUE SHIELD | Source: Ambulatory Visit | Attending: Infectious Disease | Admitting: Infectious Disease

## 2022-06-12 DIAGNOSIS — Z79899 Other long term (current) drug therapy: Secondary | ICD-10-CM | POA: Diagnosis not present

## 2022-06-12 DIAGNOSIS — B20 Human immunodeficiency virus [HIV] disease: Secondary | ICD-10-CM | POA: Insufficient documentation

## 2022-06-13 ENCOUNTER — Other Ambulatory Visit: Payer: Self-pay | Admitting: Infectious Disease

## 2022-06-13 DIAGNOSIS — G4733 Obstructive sleep apnea (adult) (pediatric): Secondary | ICD-10-CM | POA: Diagnosis not present

## 2022-06-13 DIAGNOSIS — B20 Human immunodeficiency virus [HIV] disease: Secondary | ICD-10-CM

## 2022-06-13 LAB — T-HELPER CELL (CD4) - (RCID CLINIC ONLY)
CD4 % Helper T Cell: 58 % (ref 33–65)
CD4 T Cell Abs: 606 /uL (ref 400–1790)

## 2022-06-13 LAB — URINE CYTOLOGY ANCILLARY ONLY
Chlamydia: NEGATIVE
Comment: NEGATIVE
Comment: NORMAL
Neisseria Gonorrhea: NEGATIVE

## 2022-06-15 LAB — COMPLETE METABOLIC PANEL WITH GFR
AG Ratio: 2.1 (calc) (ref 1.0–2.5)
ALT: 11 U/L (ref 9–46)
AST: 11 U/L (ref 10–40)
Albumin: 4.8 g/dL (ref 3.6–5.1)
Alkaline phosphatase (APISO): 58 U/L (ref 36–130)
BUN: 16 mg/dL (ref 7–25)
CO2: 27 mmol/L (ref 20–32)
Calcium: 10 mg/dL (ref 8.6–10.3)
Chloride: 103 mmol/L (ref 98–110)
Creat: 1.04 mg/dL (ref 0.60–1.29)
Globulin: 2.3 g/dL (calc) (ref 1.9–3.7)
Glucose, Bld: 86 mg/dL (ref 65–99)
Potassium: 4.3 mmol/L (ref 3.5–5.3)
Sodium: 140 mmol/L (ref 135–146)
Total Bilirubin: 0.3 mg/dL (ref 0.2–1.2)
Total Protein: 7.1 g/dL (ref 6.1–8.1)
eGFR: 88 mL/min/{1.73_m2} (ref >=60)

## 2022-06-15 LAB — CBC WITH DIFFERENTIAL/PLATELET
Absolute Monocytes: 360 cells/uL (ref 200–950)
Basophils Absolute: 20 cells/uL (ref 0–200)
Basophils Relative: 0.4 %
Eosinophils Absolute: 80 cells/uL (ref 15–500)
Eosinophils Relative: 1.6 %
HCT: 46.4 % (ref 38.5–50.0)
Hemoglobin: 15.7 g/dL (ref 13.2–17.1)
Lymphs Abs: 1225 cells/uL (ref 850–3900)
MCH: 30.1 pg (ref 27.0–33.0)
MCHC: 33.8 g/dL (ref 32.0–36.0)
MCV: 89.1 fL (ref 80.0–100.0)
MPV: 9.8 fL (ref 7.5–12.5)
Monocytes Relative: 7.2 %
Neutro Abs: 3315 cells/uL (ref 1500–7800)
Neutrophils Relative %: 66.3 %
Platelets: 304 10*3/uL (ref 140–400)
RBC: 5.21 10*6/uL (ref 4.20–5.80)
RDW: 14.5 % (ref 11.0–15.0)
Total Lymphocyte: 24.5 %
WBC: 5 10*3/uL (ref 3.8–10.8)

## 2022-06-15 LAB — LIPID PANEL
Cholesterol: 158 mg/dL (ref <200)
HDL: 35 mg/dL — ABNORMAL LOW (ref >=40)
LDL Cholesterol (Calc): 92 mg/dL (calc)
Non-HDL Cholesterol (Calc): 123 mg/dL (calc) (ref <130)
Total CHOL/HDL Ratio: 4.5 (calc) (ref <5.0)
Triglycerides: 221 mg/dL — ABNORMAL HIGH (ref <150)

## 2022-06-15 LAB — RPR: RPR Ser Ql: NONREACTIVE

## 2022-06-15 LAB — HIV-1 RNA QUANT-NO REFLEX-BLD
HIV 1 RNA Quant: NOT DETECTED Copies/mL
HIV-1 RNA Quant, Log: NOT DETECTED Log cps/mL

## 2022-06-25 ENCOUNTER — Encounter: Payer: Self-pay | Admitting: Infectious Disease

## 2022-06-25 DIAGNOSIS — E785 Hyperlipidemia, unspecified: Secondary | ICD-10-CM | POA: Insufficient documentation

## 2022-06-25 DIAGNOSIS — Z7185 Encounter for immunization safety counseling: Secondary | ICD-10-CM

## 2022-06-25 HISTORY — DX: Encounter for immunization safety counseling: Z71.85

## 2022-06-25 HISTORY — DX: Hyperlipidemia, unspecified: E78.5

## 2022-06-25 NOTE — Progress Notes (Unsigned)
Subjective:  Complaint follow-up for HIV disease  Patient ID: Thomas Armstrong, male    DOB: 10/19/1970, 51 y.o.   MRN: [id number]  HPI  Thomas Armstrong is a 51 year old Caucasian man with HIV and also comorbid obesity and HTN.  He was in the DO-IT ACTG study looking at weight loss and eyes to Pifeltro and DESCOVY.  He saw no difference in his weight while on Pifeltro DESCOVY.  In the interim his employer apparently began covering at full cost the price of WEgovy for weight loss. He began this but because of it had to be taken out of the study.  He went back on BIKTARVY which she is quite happy with.  He has been able to lose at least 15 pounds already so far on Wegovy and is continuing onwards.  I did also discuss the results of the reprieve study and data showing benefit from statin in people who have HIV over the age of 2  Past Medical History:  Diagnosis Date   HIV infection ([place])    HTN, white coat 02/21/2016   Hypertension    Hypertension 04/30/2017    Past Surgical History:  Procedure Laterality Date   COLONOSCOPY WITH PROPOFOL N/A 10/05/2021   Procedure: COLONOSCOPY WITH PROPOFOL;  Surgeon: Eloise Harman, DO;  [facility]: [facility];  Service: Endoscopy;  Laterality: N/A;  10:30 / ASA 2 (Pt will not need Pre-op)   POLYPECTOMY  10/05/2021   Procedure: POLYPECTOMY;  Surgeon: Eloise Harman, DO;  Location: [facility];  Service: Endoscopy;;    Family History  Problem Relation Age of Onset   Stroke Mother    Stroke Father    Heart disease Paternal Grandmother    Stroke Paternal Grandmother       Social History   Socioeconomic History   Marital status: Married    Spouse name: Not on file   Number of children: Not on file   Years of education: Not on file   Highest education level: Not on file  Occupational History   Not on file  Tobacco Use   Smoking status: Former    Types: Cigarettes    Quit date: 2020    Years since quitting: 3.9   Smokeless tobacco:  Never  Vaping Use   Vaping Use: Some days   Substances: Nicotine, Flavoring  Substance and Sexual Activity   Alcohol use: Yes    Alcohol/week: 0.0 standard drinks of alcohol    Comment: rarely   Drug use: No   Sexual activity: Yes    Comment: declined condoms  Other Topics Concern   Not on file  Social History Narrative   Not on file   Social Determinants of Health   Financial Resource Strain: Not on file  Food Insecurity: Not on file  Transportation Needs: Not on file  Physical Activity: Not on file  Stress: Not on file  Social Connections: Not on file    Allergies  Allergen Reactions   Codeine     REACTION: makes pt very sleepy     Current Outpatient Medications:    azelastine (ASTELIN) 0.1 % nasal spray, Place 2 sprays into both nostrils daily as needed for allergies., Disp: , Rfl:    bictegravir-emtricitabine-tenofovir AF (BIKTARVY) 50-200-25 MG TABS tablet, TAKE 1 TABLET BY MOUTH DAILY, Disp: 30 tablet, Rfl: 0   cetirizine (ZYRTEC) 10 MG tablet, Take 10 mg by mouth daily., Disp: , Rfl:    Cholecalciferol (VITAMIN D3) 50 MCG (2000  UT) TABS, Take 2,000 Units by mouth daily., Disp: , Rfl:    Semaglutide-Weight Management (WEGOVY) 0.25 MG/0.5ML SOAJ, , Disp: , Rfl:    Semaglutide-Weight Management (WEGOVY) 1 MG/0.5ML SOAJ, 0.5 mL Subcutaneous Once weekly 28 days, Disp: 2 mL, Rfl: 0   zinc sulfate 220 (50 Zn) MG capsule, Take 1 capsule (220 mg total) by mouth daily., Disp: 30 capsule, Rfl: 0   Review of Systems  Constitutional:  Negative for activity change, appetite change, chills, diaphoresis, fatigue, fever and unexpected weight change.  HENT:  Negative for congestion, rhinorrhea, sinus pressure, sneezing, sore throat and trouble swallowing.   Eyes:  Negative for photophobia and visual disturbance.  Respiratory:  Negative for cough, chest tightness, shortness of breath, wheezing and stridor.   Cardiovascular:  Negative for chest pain, palpitations and leg swelling.   Gastrointestinal:  Negative for abdominal distention, abdominal pain, anal bleeding, blood in stool, constipation, diarrhea, nausea and vomiting.  Genitourinary:  Negative for difficulty urinating, dysuria, flank pain and hematuria.  Musculoskeletal:  Negative for arthralgias, back pain, gait problem, joint swelling and myalgias.  Skin:  Negative for color change, pallor, rash and wound.  Neurological:  Negative for dizziness, tremors, weakness and light-headedness.  Hematological:  Negative for adenopathy. Does not bruise/bleed easily.  Psychiatric/Behavioral:  Negative for agitation, behavioral problems, confusion, decreased concentration, dysphoric mood and sleep disturbance.        Objective:   Physical Exam Constitutional:      Appearance: He is well-developed.  HENT:     Head: Normocephalic and atraumatic.  Eyes:     Conjunctiva/sclera: Conjunctivae normal.  Cardiovascular:     Rate and Rhythm: Normal rate and regular rhythm.  Pulmonary:     Effort: Pulmonary effort is normal. No respiratory distress.     Breath sounds: No wheezing.  Abdominal:     General: There is no distension.     Palpations: Abdomen is soft.  Musculoskeletal:        General: No tenderness. Normal range of motion.     Cervical back: Normal range of motion and neck supple.  Skin:    General: Skin is warm and dry.     Coloration: Skin is not pale.     Findings: No erythema or rash.  Neurological:     General: No focal deficit present.     Mental Status: He is alert and oriented to person, place, and time.  Psychiatric:        Mood and Affect: Mood normal.        Behavior: Behavior normal.        Thought Content: Thought content normal.        Judgment: Judgment normal.           Assessment & Plan:   HIV disease:  I have reviewed Thomas Armstrong's labs including viral load which was  Lab Results  Component Value Date   HIV1RNAQUANT Not Detected 06/12/2022   and cd4 which was  Lab  Results  Component Value Date   CD4TABS 606 06/12/2022     I am continuing patient's prescription for Biktarvy    HTN: he will continue his lisinopril  Hyperlipidemia: will send in pitavastatina if this is not covered send an alternative statin such as atorvastatin  Obesity: He is continue on Wegovy.   Seen counseling recommended updated COVID-19 vaccine which he did not want

## 2022-06-26 ENCOUNTER — Other Ambulatory Visit: Payer: Self-pay

## 2022-06-26 ENCOUNTER — Encounter: Payer: Self-pay | Admitting: Infectious Disease

## 2022-06-26 ENCOUNTER — Ambulatory Visit: Payer: BLUE CROSS/BLUE SHIELD | Admitting: Infectious Disease

## 2022-06-26 VITALS — BP 116/76 | HR 92 | Temp 98.5°F | Ht 73.0 in | Wt 269.0 lb

## 2022-06-26 DIAGNOSIS — Z7185 Encounter for immunization safety counseling: Secondary | ICD-10-CM

## 2022-06-26 DIAGNOSIS — B20 Human immunodeficiency virus [HIV] disease: Secondary | ICD-10-CM | POA: Diagnosis not present

## 2022-06-26 DIAGNOSIS — E785 Hyperlipidemia, unspecified: Secondary | ICD-10-CM

## 2022-06-26 DIAGNOSIS — Z87891 Personal history of nicotine dependence: Secondary | ICD-10-CM | POA: Diagnosis not present

## 2022-06-26 DIAGNOSIS — I10 Essential (primary) hypertension: Secondary | ICD-10-CM | POA: Diagnosis not present

## 2022-06-26 DIAGNOSIS — Z6832 Body mass index (BMI) 32.0-32.9, adult: Secondary | ICD-10-CM | POA: Diagnosis not present

## 2022-06-26 DIAGNOSIS — R635 Abnormal weight gain: Secondary | ICD-10-CM

## 2022-06-26 MED ORDER — PITAVASTATIN MAGNESIUM 4 MG PO TABS: 4.0000 mg | ORAL_TABLET | Freq: Every day | ORAL | 11 refills | Status: DC

## 2022-06-26 MED ORDER — BIKTARVY 50-200-25 MG PO TABS: 1.0000 | ORAL_TABLET | Freq: Every day | ORAL | 11 refills | Status: DC

## 2022-07-10 DIAGNOSIS — G4733 Obstructive sleep apnea (adult) (pediatric): Secondary | ICD-10-CM | POA: Diagnosis not present

## 2022-07-10 DIAGNOSIS — R0683 Snoring: Secondary | ICD-10-CM | POA: Diagnosis not present

## 2022-07-11 ENCOUNTER — Telehealth: Payer: Self-pay

## 2022-07-11 DIAGNOSIS — B20 Human immunodeficiency virus [HIV] disease: Secondary | ICD-10-CM

## 2022-07-11 DIAGNOSIS — E785 Hyperlipidemia, unspecified: Secondary | ICD-10-CM

## 2022-07-11 MED ORDER — ATORVASTATIN CALCIUM 20 MG PO TABS: 20.0000 mg | ORAL_TABLET | Freq: Every day | ORAL | 5 refills | Status: DC

## 2022-07-11 NOTE — Telephone Encounter (Signed)
Pitavastatin will need prior authorization. In order to meet approval criteria, patient must have tried and failed 3 alternative therapies.   Beryle Flock, RN

## 2022-07-11 NOTE — Addendum Note (Signed)
Addended by: Lucie Leather D on: 07/11/2022 02:28 PM   Modules accepted: Orders

## 2022-07-14 DIAGNOSIS — G4733 Obstructive sleep apnea (adult) (pediatric): Secondary | ICD-10-CM | POA: Diagnosis not present

## 2022-08-10 DIAGNOSIS — G4733 Obstructive sleep apnea (adult) (pediatric): Secondary | ICD-10-CM | POA: Diagnosis not present

## 2022-08-10 DIAGNOSIS — R0683 Snoring: Secondary | ICD-10-CM | POA: Diagnosis not present

## 2022-08-22 DIAGNOSIS — G4733 Obstructive sleep apnea (adult) (pediatric): Secondary | ICD-10-CM | POA: Diagnosis not present

## 2022-09-08 DIAGNOSIS — R0683 Snoring: Secondary | ICD-10-CM | POA: Diagnosis not present

## 2022-09-08 DIAGNOSIS — G4733 Obstructive sleep apnea (adult) (pediatric): Secondary | ICD-10-CM | POA: Diagnosis not present

## 2022-10-09 DIAGNOSIS — G4733 Obstructive sleep apnea (adult) (pediatric): Secondary | ICD-10-CM | POA: Diagnosis not present

## 2022-10-09 DIAGNOSIS — R0683 Snoring: Secondary | ICD-10-CM | POA: Diagnosis not present

## 2022-11-20 ENCOUNTER — Other Ambulatory Visit: Payer: Self-pay | Admitting: Infectious Disease

## 2022-11-20 DIAGNOSIS — G4733 Obstructive sleep apnea (adult) (pediatric): Secondary | ICD-10-CM | POA: Diagnosis not present

## 2022-11-20 DIAGNOSIS — B20 Human immunodeficiency virus [HIV] disease: Secondary | ICD-10-CM

## 2022-11-20 DIAGNOSIS — E785 Hyperlipidemia, unspecified: Secondary | ICD-10-CM

## 2022-12-20 DIAGNOSIS — J209 Acute bronchitis, unspecified: Secondary | ICD-10-CM | POA: Diagnosis not present

## 2022-12-20 DIAGNOSIS — J019 Acute sinusitis, unspecified: Secondary | ICD-10-CM | POA: Diagnosis not present

## 2023-02-18 DIAGNOSIS — G4733 Obstructive sleep apnea (adult) (pediatric): Secondary | ICD-10-CM | POA: Diagnosis not present

## 2023-03-21 DIAGNOSIS — G4733 Obstructive sleep apnea (adult) (pediatric): Secondary | ICD-10-CM | POA: Diagnosis not present

## 2023-04-01 ENCOUNTER — Other Ambulatory Visit: Payer: Self-pay | Admitting: Infectious Disease

## 2023-04-01 DIAGNOSIS — B20 Human immunodeficiency virus [HIV] disease: Secondary | ICD-10-CM

## 2023-04-20 DIAGNOSIS — G4733 Obstructive sleep apnea (adult) (pediatric): Secondary | ICD-10-CM | POA: Diagnosis not present

## 2023-05-02 ENCOUNTER — Other Ambulatory Visit: Payer: Self-pay | Admitting: Infectious Disease

## 2023-05-02 DIAGNOSIS — B20 Human immunodeficiency virus [HIV] disease: Secondary | ICD-10-CM

## 2023-05-05 NOTE — Telephone Encounter (Signed)
90 tablets were dispensed 04/19/23. Additional refills to be provided at 12/17 appointment.   Sandie Ano, RN

## 2023-05-16 DIAGNOSIS — G4730 Sleep apnea, unspecified: Secondary | ICD-10-CM | POA: Diagnosis not present

## 2023-05-16 DIAGNOSIS — E119 Type 2 diabetes mellitus without complications: Secondary | ICD-10-CM | POA: Diagnosis not present

## 2023-05-16 DIAGNOSIS — Z1331 Encounter for screening for depression: Secondary | ICD-10-CM | POA: Diagnosis not present

## 2023-05-16 DIAGNOSIS — Z6837 Body mass index (BMI) 37.0-37.9, adult: Secondary | ICD-10-CM | POA: Diagnosis not present

## 2023-05-16 DIAGNOSIS — R7309 Other abnormal glucose: Secondary | ICD-10-CM | POA: Diagnosis not present

## 2023-05-16 DIAGNOSIS — Z0001 Encounter for general adult medical examination with abnormal findings: Secondary | ICD-10-CM | POA: Diagnosis not present

## 2023-05-16 DIAGNOSIS — E6609 Other obesity due to excess calories: Secondary | ICD-10-CM | POA: Diagnosis not present

## 2023-05-16 DIAGNOSIS — G4733 Obstructive sleep apnea (adult) (pediatric): Secondary | ICD-10-CM | POA: Diagnosis not present

## 2023-05-19 DIAGNOSIS — G4733 Obstructive sleep apnea (adult) (pediatric): Secondary | ICD-10-CM | POA: Diagnosis not present

## 2023-05-24 ENCOUNTER — Other Ambulatory Visit: Payer: Self-pay | Admitting: Infectious Disease

## 2023-05-24 DIAGNOSIS — B20 Human immunodeficiency virus [HIV] disease: Secondary | ICD-10-CM

## 2023-06-10 ENCOUNTER — Other Ambulatory Visit: Payer: BLUE CROSS/BLUE SHIELD

## 2023-06-10 ENCOUNTER — Other Ambulatory Visit: Payer: Self-pay

## 2023-06-10 ENCOUNTER — Other Ambulatory Visit (HOSPITAL_COMMUNITY)
Admission: RE | Admit: 2023-06-10 | Discharge: 2023-06-10 | Disposition: A | Discharge status: Home / Self Care | Payer: BLUE CROSS/BLUE SHIELD | Source: Ambulatory Visit | Attending: Infectious Disease | Admitting: Infectious Disease

## 2023-06-10 DIAGNOSIS — Z113 Encounter for screening for infections with a predominantly sexual mode of transmission: Secondary | ICD-10-CM | POA: Diagnosis not present

## 2023-06-10 DIAGNOSIS — B20 Human immunodeficiency virus [HIV] disease: Secondary | ICD-10-CM

## 2023-06-11 LAB — T-HELPER CELL (CD4) - (RCID CLINIC ONLY)
CD4 % Helper T Cell: 55 % (ref 33–65)
CD4 T Cell Abs: 669 /uL (ref 400–1790)

## 2023-06-11 LAB — URINE CYTOLOGY ANCILLARY ONLY
Chlamydia: NEGATIVE
Comment: NEGATIVE
Comment: NORMAL
Neisseria Gonorrhea: NEGATIVE

## 2023-06-12 LAB — CBC WITH DIFFERENTIAL/PLATELET
Absolute Lymphocytes: 1356 {cells}/uL (ref 850–3900)
Absolute Monocytes: 383 {cells}/uL (ref 200–950)
Basophils Absolute: 43 {cells}/uL (ref 0–200)
Basophils Relative: 0.6 %
Eosinophils Absolute: 78 {cells}/uL (ref 15–500)
Eosinophils Relative: 1.1 %
HCT: 49.3 % (ref 38.5–50.0)
Hemoglobin: 16.2 g/dL (ref 13.2–17.1)
MCH: 29.7 pg (ref 27.0–33.0)
MCHC: 32.9 g/dL (ref 32.0–36.0)
MCV: 90.3 fL (ref 80.0–100.0)
MPV: 10.2 fL (ref 7.5–12.5)
Monocytes Relative: 5.4 %
Neutro Abs: 5240 {cells}/uL (ref 1500–7800)
Neutrophils Relative %: 73.8 %
Platelets: 302 10*3/uL (ref 140–400)
RBC: 5.46 10*6/uL (ref 4.20–5.80)
RDW: 13.8 % (ref 11.0–15.0)
Total Lymphocyte: 19.1 %
WBC: 7.1 10*3/uL (ref 3.8–10.8)

## 2023-06-12 LAB — COMPLETE METABOLIC PANEL WITH GFR
AG Ratio: 1.8 (calc) (ref 1.0–2.5)
ALT: 21 U/L (ref 9–46)
AST: 17 U/L (ref 10–35)
Albumin: 4.6 g/dL (ref 3.6–5.1)
Alkaline phosphatase (APISO): 66 U/L (ref 35–144)
BUN: 15 mg/dL (ref 7–25)
CO2: 31 mmol/L (ref 20–32)
Calcium: 9.9 mg/dL (ref 8.6–10.3)
Chloride: 102 mmol/L (ref 98–110)
Creat: 1.19 mg/dL (ref 0.70–1.30)
Globulin: 2.5 g/dL (ref 1.9–3.7)
Glucose, Bld: 78 mg/dL (ref 65–99)
Potassium: 4 mmol/L (ref 3.5–5.3)
Sodium: 139 mmol/L (ref 135–146)
Total Bilirubin: 0.5 mg/dL (ref 0.2–1.2)
Total Protein: 7.1 g/dL (ref 6.1–8.1)
eGFR: 74 mL/min/{1.73_m2} (ref >=60)

## 2023-06-12 LAB — LIPID PANEL
Cholesterol: 115 mg/dL (ref <200)
HDL: 38 mg/dL — ABNORMAL LOW (ref >=40)
LDL Cholesterol (Calc): 49 mg/dL
Non-HDL Cholesterol (Calc): 77 mg/dL (ref <130)
Total CHOL/HDL Ratio: 3 (calc) (ref <5.0)
Triglycerides: 213 mg/dL — ABNORMAL HIGH (ref <150)

## 2023-06-12 LAB — HIV-1 RNA QUANT-NO REFLEX-BLD
HIV 1 RNA Quant: 116 {copies}/mL — ABNORMAL HIGH
HIV-1 RNA Quant, Log: 2.06 {Log_copies}/mL — ABNORMAL HIGH

## 2023-06-12 LAB — RPR: RPR Ser Ql: NONREACTIVE

## 2023-06-18 DIAGNOSIS — G4733 Obstructive sleep apnea (adult) (pediatric): Secondary | ICD-10-CM | POA: Diagnosis not present

## 2023-06-23 NOTE — Progress Notes (Signed)
Subjective:  Complaint follow-up for HIV disease on medications  Patient ID: Thomas Armstrong, male    DOB: Aug 21, 1970, 52 y.o.   MRN: [id number]  HPI  Discussed the use of AI scribe software for clinical note transcription with the patient, who gave verbal consent to proceed.  History of Present Illness   The patient, with a history of HIV and obesity, presents for a routine follow-up. They had previously participated in the Do It study but had to withdraw due to starting a weight loss medication, ZHYQMV. They report that the medication initially helped them lose weight, dropping from 289 lbs to 256 lbs, but recently they feel it's losing its effectiveness in curbing their appetite. They deny having diabetes and confirm that their insurance covers the weight loss medication.  They were previously on Lisinopril for hypertension, but it was discontinued as their blood pressure improved with weight loss. However, they note that their blood pressure was slightly elevated today, attributing it to 'white coat hypertension' and neck discomfort. They have been seeing a chiropractor for the neck issue and report improvement.  For their cholesterol, they are on Atorvastatin (Lipitor). They also take Biktarvy for HIV management and report no missed doses. Their recent labs showed a viral load of 116, which is higher than usual, but they are not concerned as they believe it could be a 'blip.' Their CD4 count was 669, and their cholesterol panel showed a significant reduction in bad cholesterol.  They deny having anal sex (he has sex only with women as well) and therefore do not require an anal Pap smear for rectal cancer screening. They received their flu shot and declined a COVID-19 vaccine. They are due for a pneumonia vaccine and agree to receive the Prevnar 20.       Past Medical History:  Diagnosis Date   HIV infection ([facility])    HTN, white coat 02/21/2016   Hyperlipidemia 06/25/2022    Hyperlipidemia 06/25/2022   Hypertension    Hypertension 04/30/2017   Vaccine counseling 06/25/2022    Past Surgical History:  Procedure Laterality Date   COLONOSCOPY WITH PROPOFOL N/A 10/05/2021   Procedure: COLONOSCOPY WITH PROPOFOL;  Surgeon: Lanelle Bal, DO;  [facility];  Service: Endoscopy;  Laterality: N/A;  10:30 / ASA 2 (Pt will not need Pre-op)   POLYPECTOMY  10/05/2021   Procedure: POLYPECTOMY;  Surgeon: Lanelle Bal, DO;  [facility];  Service: Endoscopy;;    Family History  Problem Relation Age of Onset   Stroke Mother    Stroke Father    Heart disease Paternal Grandmother    Stroke Paternal Grandmother       Social History   Socioeconomic History   Marital status: Married    Spouse name: Not on file   Number of children: Not on file   Years of education: Not on file   Highest education level: Not on file  Occupational History   Not on file  Tobacco Use   Smoking status: Former    Current packs/day: 0.00    Types: Cigarettes    Quit date: 2020    Years since quitting: 4.9   Smokeless tobacco: Never  Vaping Use   Vaping status: Some Days   Substances: Nicotine, Flavoring  Substance and Sexual Activity   Alcohol use: Yes    Alcohol/week: 0.0 standard drinks of alcohol    Comment: rarely   Drug use: No   Sexual activity: Yes  Comment: declined condoms  Other Topics Concern   Not on file  Social History Narrative   Not on file   Social Drivers of Health   Financial Resource Strain: Not on file  Food Insecurity: Not on file  Transportation Needs: Not on file  Physical Activity: Not on file  Stress: Not on file  Social Connections: Not on file    Allergies  Allergen Reactions   Codeine     REACTION: makes pt very sleepy     Current Outpatient Medications:    atorvastatin (LIPITOR) 20 MG tablet, TAKE 1 TABLET BY MOUTH DAILY, Disp: 90 tablet, Rfl: 3   azelastine (ASTELIN) 0.1 % nasal spray, Place 2  sprays into both nostrils daily as needed for allergies., Disp: , Rfl:    BIKTARVY 50-200-25 MG TABS tablet, TAKE 1 TABLET BY MOUTH DAILY, Disp: 90 tablet, Rfl: 3   cetirizine (ZYRTEC) 10 MG tablet, Take 10 mg by mouth daily., Disp: , Rfl:    Cholecalciferol (VITAMIN D3) 50 MCG (2000 UT) TABS, Take 2,000 Units by mouth daily., Disp: , Rfl:    Semaglutide-Weight Management (WEGOVY) 0.25 MG/0.5ML SOAJ, , Disp: , Rfl:    Semaglutide-Weight Management (WEGOVY) 1 MG/0.5ML SOAJ, 0.5 mL Subcutaneous Once weekly 28 days, Disp: 2 mL, Rfl: 0   zinc sulfate 220 (50 Zn) MG capsule, Take 1 capsule (220 mg total) by mouth daily., Disp: 30 capsule, Rfl: 0   Review of Systems  Constitutional:  Negative for activity change, appetite change, chills, diaphoresis, fatigue, fever and unexpected weight change.  HENT:  Negative for congestion, rhinorrhea, sinus pressure, sneezing, sore throat and trouble swallowing.   Eyes:  Negative for photophobia and visual disturbance.  Respiratory:  Negative for cough, chest tightness, shortness of breath, wheezing and stridor.   Cardiovascular:  Negative for chest pain, palpitations and leg swelling.  Gastrointestinal:  Negative for abdominal distention, abdominal pain, anal bleeding, blood in stool, constipation, diarrhea, nausea and vomiting.  Genitourinary:  Negative for difficulty urinating, dysuria, flank pain and hematuria.  Musculoskeletal:  Positive for arthralgias. Negative for back pain, gait problem, joint swelling and myalgias.  Skin:  Negative for color change, pallor, rash and wound.  Neurological:  Negative for dizziness, tremors, weakness and light-headedness.  Hematological:  Negative for adenopathy. Does not bruise/bleed easily.  Psychiatric/Behavioral:  Negative for agitation, behavioral problems, confusion, decreased concentration, dysphoric mood and sleep disturbance.        Objective:   Physical Exam Constitutional:      Appearance: He is  well-developed.  HENT:     Head: Normocephalic and atraumatic.  Eyes:     Conjunctiva/sclera: Conjunctivae normal.  Cardiovascular:     Rate and Rhythm: Normal rate and regular rhythm.  Pulmonary:     Effort: Pulmonary effort is normal. No respiratory distress.     Breath sounds: No wheezing.  Abdominal:     General: There is no distension.     Palpations: Abdomen is soft.  Musculoskeletal:        General: No tenderness. Normal range of motion.     Cervical back: Normal range of motion and neck supple.  Skin:    General: Skin is warm and dry.     Coloration: Skin is not pale.     Findings: No erythema or rash.  Neurological:     General: No focal deficit present.     Mental Status: He is alert and oriented to person, place, and time.  Psychiatric:  Mood and Affect: Mood normal.        Behavior: Behavior normal.        Thought Content: Thought content normal.        Judgment: Judgment normal.           Assessment & Plan:   Assessment and Plan    HIV Viral load slightly elevated at 116, likely a blip. CD4 count stable at 669. Adherent to Biktarvy. -Continue Biktarvy as prescribed.  Hyperlipidemia LDL controlled in the 40s on Atorvastatin. -Continue Atorvastatin as prescribed.  Obesity Significant weight loss from 289 to 256 lbs on Wegovy, but recent plateau and increased appetite. -Continue Wegovy as prescribed.  Hypertension Off Lisinopril due to improved blood pressure with weight loss. Recent BP slightly elevated, possibly due to white coat hypertension. -Monitor blood pressure.  General Health Maintenance -Administer Prevnar 20 today for pneumonia prevention. -Schedule follow-up appointment in 10 months per [facility] guidelines, with labs prior to visit.      Neck pain: continue current therapies w chiropractor and if needed PT

## 2023-06-24 ENCOUNTER — Encounter: Payer: Self-pay | Admitting: Infectious Disease

## 2023-06-24 ENCOUNTER — Ambulatory Visit: Payer: BLUE CROSS/BLUE SHIELD | Admitting: Infectious Disease

## 2023-06-24 ENCOUNTER — Other Ambulatory Visit: Payer: Self-pay

## 2023-06-24 VITALS — BP 153/102 | HR 76 | Temp 96.8°F | Ht 73.0 in | Wt 277.0 lb

## 2023-06-24 DIAGNOSIS — B20 Human immunodeficiency virus [HIV] disease: Secondary | ICD-10-CM

## 2023-06-24 DIAGNOSIS — E785 Hyperlipidemia, unspecified: Secondary | ICD-10-CM | POA: Diagnosis not present

## 2023-06-24 DIAGNOSIS — R635 Abnormal weight gain: Secondary | ICD-10-CM | POA: Diagnosis not present

## 2023-06-24 DIAGNOSIS — Z6832 Body mass index (BMI) 32.0-32.9, adult: Secondary | ICD-10-CM

## 2023-06-24 DIAGNOSIS — I10 Essential (primary) hypertension: Secondary | ICD-10-CM | POA: Diagnosis not present

## 2023-06-24 DIAGNOSIS — Z23 Encounter for immunization: Secondary | ICD-10-CM | POA: Diagnosis not present

## 2023-06-24 DIAGNOSIS — Z87891 Personal history of nicotine dependence: Secondary | ICD-10-CM

## 2023-06-24 DIAGNOSIS — Z7185 Encounter for immunization safety counseling: Secondary | ICD-10-CM

## 2023-06-24 MED ORDER — BIKTARVY 50-200-25 MG PO TABS: 1.0000 | ORAL_TABLET | Freq: Every day | ORAL | 3 refills | Status: DC

## 2023-06-24 MED ORDER — ATORVASTATIN CALCIUM 20 MG PO TABS: 20.0000 mg | ORAL_TABLET | Freq: Every day | ORAL | 3 refills | Status: DC

## 2023-07-19 DIAGNOSIS — G4733 Obstructive sleep apnea (adult) (pediatric): Secondary | ICD-10-CM | POA: Diagnosis not present

## 2023-08-17 DIAGNOSIS — G4733 Obstructive sleep apnea (adult) (pediatric): Secondary | ICD-10-CM | POA: Diagnosis not present

## 2023-08-24 DIAGNOSIS — G4733 Obstructive sleep apnea (adult) (pediatric): Secondary | ICD-10-CM | POA: Diagnosis not present

## 2023-09-14 DIAGNOSIS — G4733 Obstructive sleep apnea (adult) (pediatric): Secondary | ICD-10-CM | POA: Diagnosis not present

## 2023-10-23 ENCOUNTER — Other Ambulatory Visit (HOSPITAL_BASED_OUTPATIENT_CLINIC_OR_DEPARTMENT_OTHER): Payer: Self-pay

## 2023-10-23 MED ORDER — ZEPBOUND 10 MG/0.5ML ~~LOC~~ SOAJ
10.0000 mg | SUBCUTANEOUS | 0 refills | Status: DC
  Filled 2023-10-23: qty 2, 28d supply, fill #0

## 2023-10-24 ENCOUNTER — Other Ambulatory Visit: Payer: Self-pay

## 2023-10-24 ENCOUNTER — Other Ambulatory Visit (HOSPITAL_BASED_OUTPATIENT_CLINIC_OR_DEPARTMENT_OTHER): Payer: Self-pay

## 2023-10-27 ENCOUNTER — Other Ambulatory Visit (HOSPITAL_BASED_OUTPATIENT_CLINIC_OR_DEPARTMENT_OTHER): Payer: Self-pay

## 2023-11-03 ENCOUNTER — Other Ambulatory Visit (HOSPITAL_BASED_OUTPATIENT_CLINIC_OR_DEPARTMENT_OTHER): Payer: Self-pay

## 2023-11-06 NOTE — Progress Notes (Signed)
 The ASCVD Risk score (Arnett DK, et al., 2019) failed to calculate for the following reasons:   The valid total cholesterol range is 130 to 320 mg/dL  [name], BSN, RN

## 2023-11-16 DIAGNOSIS — G4733 Obstructive sleep apnea (adult) (pediatric): Secondary | ICD-10-CM | POA: Diagnosis not present

## 2023-11-21 DIAGNOSIS — G4733 Obstructive sleep apnea (adult) (pediatric): Secondary | ICD-10-CM | POA: Diagnosis not present

## 2023-11-25 DIAGNOSIS — M25512 Pain in left shoulder: Secondary | ICD-10-CM | POA: Diagnosis not present

## 2023-11-25 DIAGNOSIS — M542 Cervicalgia: Secondary | ICD-10-CM | POA: Diagnosis not present

## 2023-12-08 DIAGNOSIS — M5412 Radiculopathy, cervical region: Secondary | ICD-10-CM | POA: Diagnosis not present

## 2023-12-17 DIAGNOSIS — G4733 Obstructive sleep apnea (adult) (pediatric): Secondary | ICD-10-CM | POA: Diagnosis not present

## 2023-12-22 DIAGNOSIS — M5412 Radiculopathy, cervical region: Secondary | ICD-10-CM | POA: Diagnosis not present

## 2024-01-05 DIAGNOSIS — M5412 Radiculopathy, cervical region: Secondary | ICD-10-CM | POA: Diagnosis not present

## 2024-01-16 DIAGNOSIS — G4733 Obstructive sleep apnea (adult) (pediatric): Secondary | ICD-10-CM | POA: Diagnosis not present

## 2024-02-14 DIAGNOSIS — G4733 Obstructive sleep apnea (adult) (pediatric): Secondary | ICD-10-CM | POA: Diagnosis not present

## 2024-02-21 DIAGNOSIS — G4733 Obstructive sleep apnea (adult) (pediatric): Secondary | ICD-10-CM | POA: Diagnosis not present

## 2024-04-06 ENCOUNTER — Other Ambulatory Visit: Payer: BLUE CROSS/BLUE SHIELD

## 2024-04-06 ENCOUNTER — Other Ambulatory Visit: Payer: Self-pay

## 2024-04-06 ENCOUNTER — Other Ambulatory Visit (HOSPITAL_COMMUNITY)
Admission: RE | Admit: 2024-04-06 | Discharge: 2024-04-06 | Disposition: A | Discharge status: Home / Self Care | Source: Ambulatory Visit | Attending: Infectious Disease | Admitting: Infectious Disease

## 2024-04-06 DIAGNOSIS — B20 Human immunodeficiency virus [HIV] disease: Secondary | ICD-10-CM | POA: Diagnosis not present

## 2024-04-06 DIAGNOSIS — E785 Hyperlipidemia, unspecified: Secondary | ICD-10-CM

## 2024-04-06 DIAGNOSIS — Z113 Encounter for screening for infections with a predominantly sexual mode of transmission: Secondary | ICD-10-CM | POA: Diagnosis not present

## 2024-04-06 LAB — T-HELPER CELL (CD4) - (RCID CLINIC ONLY)
CD4 % Helper T Cell: 58 % (ref 33–65)
CD4 T Cell Abs: 804 /uL (ref 400–1790)

## 2024-04-07 LAB — URINE CYTOLOGY ANCILLARY ONLY
Chlamydia: NEGATIVE
Comment: NEGATIVE
Comment: NORMAL
Neisseria Gonorrhea: NEGATIVE

## 2024-04-09 LAB — LIPID PANEL
Cholesterol: 105 mg/dL (ref <200)
HDL: 43 mg/dL (ref >=40)
LDL Cholesterol (Calc): 46 mg/dL
Non-HDL Cholesterol (Calc): 62 mg/dL (ref <130)
Total CHOL/HDL Ratio: 2.4 (calc) (ref <5.0)
Triglycerides: 75 mg/dL (ref <150)

## 2024-04-09 LAB — COMPLETE METABOLIC PANEL WITHOUT GFR
AG Ratio: 2 (calc) (ref 1.0–2.5)
ALT: 16 U/L (ref 9–46)
AST: 12 U/L (ref 10–35)
Albumin: 4.5 g/dL (ref 3.6–5.1)
Alkaline phosphatase (APISO): 55 U/L (ref 35–144)
BUN: 16 mg/dL (ref 7–25)
CO2: 31 mmol/L (ref 20–32)
Calcium: 9.9 mg/dL (ref 8.6–10.3)
Chloride: 104 mmol/L (ref 98–110)
Creat: 1.2 mg/dL (ref 0.70–1.30)
Globulin: 2.3 g/dL (ref 1.9–3.7)
Glucose, Bld: 77 mg/dL (ref 65–99)
Potassium: 4.2 mmol/L (ref 3.5–5.3)
Sodium: 139 mmol/L (ref 135–146)
Total Bilirubin: 0.5 mg/dL (ref 0.2–1.2)
Total Protein: 6.8 g/dL (ref 6.1–8.1)

## 2024-04-09 LAB — HIV-1 RNA QUANT-NO REFLEX-BLD
HIV 1 RNA Quant: NOT DETECTED {copies}/mL
HIV-1 RNA Quant, Log: NOT DETECTED {Log_copies}/mL

## 2024-04-09 LAB — CBC WITH DIFFERENTIAL/PLATELET
Absolute Lymphocytes: 1487 {cells}/uL (ref 850–3900)
Absolute Monocytes: 414 {cells}/uL (ref 200–950)
Basophils Absolute: 52 {cells}/uL (ref 0–200)
Basophils Relative: 0.7 %
Eosinophils Absolute: 89 {cells}/uL (ref 15–500)
Eosinophils Relative: 1.2 %
HCT: 46.9 % (ref 38.5–50.0)
Hemoglobin: 15.4 g/dL (ref 13.2–17.1)
MCH: 30.9 pg (ref 27.0–33.0)
MCHC: 32.8 g/dL (ref 32.0–36.0)
MCV: 94.2 fL (ref 80.0–100.0)
MPV: 9.8 fL (ref 7.5–12.5)
Monocytes Relative: 5.6 %
Neutro Abs: 5358 {cells}/uL (ref 1500–7800)
Neutrophils Relative %: 72.4 %
Platelets: 294 Thousand/uL (ref 140–400)
RBC: 4.98 Million/uL (ref 4.20–5.80)
RDW: 13.7 % (ref 11.0–15.0)
Total Lymphocyte: 20.1 %
WBC: 7.4 Thousand/uL (ref 3.8–10.8)

## 2024-04-09 LAB — RPR: RPR Ser Ql: NONREACTIVE

## 2024-04-12 NOTE — Progress Notes (Unsigned)
   Subjective:  Chief complaint: follow-up for HIV disease on medications   Patient ID: Thomas Armstrong, male    DOB: [birth year], 53 y.o.   MRN: [id number]  HPI  Past Medical History:  Diagnosis Date   HIV infection ([facility])    HTN, white coat 02/21/2016   Hyperlipidemia 06/25/2022   Hyperlipidemia 06/25/2022   Hypertension    Hypertension 04/30/2017   Vaccine counseling 06/25/2022    Past Surgical History:  Procedure Laterality Date   COLONOSCOPY WITH PROPOFOL  N/A 10/05/2021   Procedure: COLONOSCOPY WITH PROPOFOL ;  Surgeon: Cindie Carlin POUR, DO;  [facility];  Service: Endoscopy;  Laterality: N/A;  10:30 / ASA 2 (Pt will not need Pre-op)   POLYPECTOMY  10/05/2021   Procedure: POLYPECTOMY;  Surgeon: Cindie Carlin POUR, DO;  [facility];  Service: Endoscopy;;    Family History  Problem Relation Age of Onset   Stroke Mother    Stroke Father    Heart disease Paternal Grandmother    Stroke Paternal Grandmother       Social History   Socioeconomic History   Marital status: Married    Spouse name: Not on file   Number of children: Not on file   Years of education: Not on file   Highest education level: Not on file  Occupational History   Not on file  Tobacco Use   Smoking status: Former    Current packs/day: 0.00    Types: Cigarettes    Quit date: 2020    Years since quitting: 5.7   Smokeless tobacco: Never  Vaping Use   Vaping status: Some Days   Substances: Nicotine, Flavoring  Substance and Sexual Activity   Alcohol use: Yes    Alcohol/week: 0.0 standard drinks of alcohol    Comment: rarely   Drug use: No   Sexual activity: Yes    Comment: declined condoms  Other Topics Concern   Not on file  Social History Narrative   Not on file   Social Drivers of Health   Financial Resource Strain: Not on file  Food Insecurity: Not on file  Transportation Needs: Not on file  Physical Activity: Not on file  Stress: Not on file  Social  Connections: Not on file    Allergies  Allergen Reactions   Codeine     REACTION: makes pt very sleepy     Current Outpatient Medications:    atorvastatin  (LIPITOR) 20 MG tablet, Take 1 tablet (20 mg total) by mouth daily., Disp: 90 tablet, Rfl: 3   azelastine (ASTELIN) 0.1 % nasal spray, Place 2 sprays into both nostrils daily as needed for allergies., Disp: , Rfl:    bictegravir-emtricitabine -tenofovir  AF (BIKTARVY ) 50-200-25 MG TABS tablet, Take 1 tablet by mouth daily., Disp: 90 tablet, Rfl: 3   cetirizine (ZYRTEC) 10 MG tablet, Take 10 mg by mouth daily., Disp: , Rfl:    Cholecalciferol (VITAMIN D3) 50 MCG (2000 UT) TABS, Take 2,000 Units by mouth daily., Disp: , Rfl:    Semaglutide -Weight Management (WEGOVY ) 1 MG/0.5ML SOAJ, 0.5 mL Subcutaneous Once weekly 28 days, Disp: 2 mL, Rfl: 0   zinc  sulfate 220 (50 Zn) MG capsule, Take 1 capsule (220 mg total) by mouth daily., Disp: 30 capsule, Rfl: 0   Review of Systems     Objective:   Physical Exam        Assessment & Plan:

## 2024-04-13 ENCOUNTER — Other Ambulatory Visit: Payer: Self-pay

## 2024-04-13 ENCOUNTER — Encounter: Payer: Self-pay | Admitting: Infectious Disease

## 2024-04-13 ENCOUNTER — Ambulatory Visit: Admitting: Infectious Disease

## 2024-04-13 VITALS — BP 135/81 | HR 105 | Temp 98.3°F | Ht 73.0 in | Wt 270.0 lb

## 2024-04-13 DIAGNOSIS — R635 Abnormal weight gain: Secondary | ICD-10-CM

## 2024-04-13 DIAGNOSIS — E669 Obesity, unspecified: Secondary | ICD-10-CM

## 2024-04-13 DIAGNOSIS — I10 Essential (primary) hypertension: Secondary | ICD-10-CM

## 2024-04-13 DIAGNOSIS — E785 Hyperlipidemia, unspecified: Secondary | ICD-10-CM

## 2024-04-13 DIAGNOSIS — B20 Human immunodeficiency virus [HIV] disease: Secondary | ICD-10-CM | POA: Diagnosis not present

## 2024-04-13 DIAGNOSIS — Z7185 Encounter for immunization safety counseling: Secondary | ICD-10-CM | POA: Diagnosis not present

## 2024-04-13 MED ORDER — BIKTARVY 50-200-25 MG PO TABS: 1.0000 | ORAL_TABLET | Freq: Every day | ORAL | 3 refills | Status: AC

## 2024-04-13 MED ORDER — ATORVASTATIN CALCIUM 20 MG PO TABS: 20.0000 mg | ORAL_TABLET | Freq: Every day | ORAL | 3 refills | Status: AC

## 2024-04-20 ENCOUNTER — Ambulatory Visit: Payer: BLUE CROSS/BLUE SHIELD | Admitting: Infectious Disease

## 2024-05-17 DIAGNOSIS — G4733 Obstructive sleep apnea (adult) (pediatric): Secondary | ICD-10-CM | POA: Diagnosis not present

## 2024-05-18 DIAGNOSIS — I10 Essential (primary) hypertension: Secondary | ICD-10-CM | POA: Diagnosis not present

## 2024-05-18 DIAGNOSIS — R7309 Other abnormal glucose: Secondary | ICD-10-CM | POA: Diagnosis not present

## 2024-05-18 DIAGNOSIS — E785 Hyperlipidemia, unspecified: Secondary | ICD-10-CM | POA: Diagnosis not present

## 2024-05-18 DIAGNOSIS — M5090 Cervical disc disorder, unspecified, unspecified cervical region: Secondary | ICD-10-CM | POA: Diagnosis not present

## 2024-05-18 DIAGNOSIS — Z0000 Encounter for general adult medical examination without abnormal findings: Secondary | ICD-10-CM | POA: Diagnosis not present

## 2024-05-18 DIAGNOSIS — Z125 Encounter for screening for malignant neoplasm of prostate: Secondary | ICD-10-CM | POA: Diagnosis not present

## 2025-02-16 ENCOUNTER — Other Ambulatory Visit

## 2025-03-02 ENCOUNTER — Encounter: Payer: Self-pay | Admitting: Infectious Disease
